# Patient Record
Sex: Female | Born: 1970 | Race: White | Hispanic: No | Marital: Single | Smoking: Never smoker
Health system: Southern US, Community
[De-identification: ages and names within clinical notes are randomized; demographics above are authoritative.]

## PROBLEM LIST (undated history)

## (undated) DIAGNOSIS — I10 Essential (primary) hypertension: Secondary | ICD-10-CM

## (undated) DIAGNOSIS — E785 Hyperlipidemia, unspecified: Secondary | ICD-10-CM

## (undated) HISTORY — DX: Hyperlipidemia, unspecified: E78.5

## (undated) HISTORY — DX: Essential (primary) hypertension: I10

## (undated) HISTORY — PX: CHOLECYSTECTOMY: SHX55

## (undated) HISTORY — PX: APPENDECTOMY: SHX54

## (undated) HISTORY — PX: ABDOMINAL HYSTERECTOMY: SHX81

---

## 2010-11-03 ENCOUNTER — Encounter: Payer: Self-pay | Admitting: Emergency Medicine

## 2010-11-03 ENCOUNTER — Ambulatory Visit
Admission: RE | Admit: 2010-11-03 | Discharge: 2010-11-03 | Payer: Self-pay | Source: Home / Self Care | Admitting: Emergency Medicine

## 2010-11-03 DIAGNOSIS — G43909 Migraine, unspecified, not intractable, without status migrainosus: Secondary | ICD-10-CM | POA: Insufficient documentation

## 2010-11-12 NOTE — Letter (Signed)
Summary: CONTROLLED MED RX POLICY  CONTROLLED MED RX POLICY   Imported By: Shelbie Proctor 11/03/2010 13:04:35  _____________________________________________________________________  External Attachment:    Type:   Image     Comment:   External Document

## 2010-11-12 NOTE — Assessment & Plan Note (Signed)
Summary: HEADACHE/TJ Room 5   Vital Signs:  Patient Profile:   40 Years Old Female CC:      Migraine x 3 days Height:     60 inches Weight:      180 pounds O2 Sat:      98 % O2 treatment:    Room Air Temp:     98.8 degrees F oral Pulse rate:   79 / minute Pulse rhythm:   regular Resp:     14 per minute BP sitting:   125 / 84  (left arm) Cuff size:   regular  Vitals Entered By: Emilio Math (November 03, 2010 12:59 PM)                  Current Allergies: No known allergies History of Present Illness Chief Complaint: Migraine x 3 days History of Present Illness: Migraine HA for 3 days.  Occurs approx Q3 months.  Started after her TAHBSO in her early 23's.  Doesn't have a current PCP or Gyn.  HA mostly in the back, sharp, photo/phonophobia.  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  No weakness, numbness, confusion, slurred speech, CP, or SOB.  Took a Percocet yesterday and it didn't help.  REVIEW OF SYSTEMS Constitutional Symptoms      Denies fever, chills, night sweats, weight loss, weight gain, and fatigue.  Eyes       Denies change in vision, eye pain, eye discharge, glasses, contact lenses, and eye surgery. Ear/Nose/Throat/Mouth       Denies hearing loss/aids, change in hearing, ear pain, ear discharge, dizziness, frequent runny nose, frequent nose bleeds, sinus problems, sore throat, hoarseness, and tooth pain or bleeding.  Respiratory       Denies dry cough, productive cough, wheezing, shortness of breath, asthma, bronchitis, and emphysema/COPD.  Cardiovascular       Denies murmurs, chest pain, and tires easily with exhertion.    Gastrointestinal       Complains of nausea/vomiting.      Denies stomach pain, diarrhea, constipation, blood in bowel movements, and indigestion. Genitourniary       Denies painful urination, kidney stones, and loss of urinary control. Neurological       Complains of headaches.      Denies  paralysis, seizures, and fainting/blackouts. Musculoskeletal       Denies muscle pain, joint pain, joint stiffness, decreased range of motion, redness, swelling, muscle weakness, and gout.  Skin       Denies bruising, unusual mles/lumps or sores, and hair/skin or nail changes.  Psych       Denies mood changes, temper/anger issues, anxiety/stress, speech problems, depression, and sleep problems.  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 Physical Exam General appearance: well developed, well nourished, mild distress from HA and wearing dark glasses Pupils: PERRLA Ears: normal, no lesions or deformities Oral/Pharynx: tongue normal, posterior pharynx without erythema or exudate Chest/Lungs: no rales, wheezes, or rhonchi bilateral, breath sounds equal without effort Heart: regular rate and  rhythm, no murmur Neurological: grossly intact and non-focal MSE: oriented to time, place, and person Assessment New Problems: MIGRAINE HEADACHE (ICD-346.90)   Plan New Medications/Changes: IMITREX 50 MG TABS (SUMATRIPTAN SUCCINATE) 1 by mouth at onset of headache, may repeat in 2 hours if not better.  Max of 2  per day.  #4 x 0, 11/03/2010, Hoyt Koch MD FLEXERIL 5 MG TABS (CYCLOBENZAPRINE HCL) 1 by mouth three times a day as needed for spasms  #15 x 0, 11/03/2010, Hoyt Koch MD ULTRACET 37.5-325 MG TABS (TRAMADOL-ACETAMINOPHEN) 1 by mouth q6 hrs as needed headache  #20 x 0, 11/03/2010, Hoyt Koch MD  New Orders: New Patient Level III [phone] Sumatriptan Succinate /6mg  injection [J3030] EMR miscellaneous medications [EMRORAL] Admin of Therapeutic Inj  intramuscular or subcutaneous [96372] Planning Comments:   Gave IM shot of Imitrex.  Gave Rx to try of Imitrex as well.  Gave Rx for Ultracet and  Flexeril.  Because this is a recurrent problem, I would like her to be followed by her PCP and/or Gyn.  If symptoms worsen or become more severe or any neurologic problems, go directly to ER or call EMS.  Encourage hydration and rest and dark.   The patient and/or caregiver has been counseled thoroughly with regard to medications prescribed including dosage, schedule, interactions, rationale for use, and possible side effects and they verbalize understanding.  Diagnoses and expected course of recovery discussed and will return if not improved as expected or if the condition worsens. Patient and/or caregiver verbalized understanding.  Prescriptions: IMITREX 50 MG TABS (SUMATRIPTAN SUCCINATE) 1 by mouth at onset of headache, may repeat in 2 hours if not better.  Max of 2 per day.  #4 x 0   Entered and Authorized by:   Hoyt Koch MD   Signed by:   Hoyt Koch MD on 11/03/2010   Method used:   Print then Give to Patient   RxID:   [id number] FLEXERIL 5 MG TABS (CYCLOBENZAPRINE HCL) 1 by mouth three times a day as needed for spasms  #15 x 0   Entered and Authorized by:   Hoyt Koch MD   Signed by:   Hoyt Koch MD on 11/03/2010   Method used:   Print then Give to Patient   RxID:   [id number] ULTRACET 37.5-325 MG TABS (TRAMADOL-ACETAMINOPHEN) 1 by mouth q6 hrs as needed headache  #20 x 0   Entered and Authorized by:   Hoyt Koch MD   Signed by:   Hoyt Koch MD on 11/03/2010   Method used:   Print then Give to Patient   RxID:   [id number]   Medication Administration  Injection # 1:    Medication: EMR miscellaneous medications    Diagnosis: MIGRAINE HEADACHE (ICD-346.90)    Route: SQ    Site: RUOQ gluteus    Exp Date: 08/12/2011    Lot #: [id number]    Mfr: [employer]    Given by: Emilio Math (November 03, 2010 1:26 PM)  Orders Added: 1)  New Patient Level III [99203] 2)  Sumatriptan Succinate /6mg  injection [J3030] 3)   EMR miscellaneous medications [EMRORAL] 4)  Admin of Therapeutic Inj  intramuscular or subcutaneous [96372]     Medication Administration  Injection # 1:    Medication: EMR miscellaneous medications    Diagnosis: MIGRAINE HEADACHE (ICD-346.90)    Route: SQ    Site: RUOQ gluteus    Exp Date: 08/12/2011    Lot #: [id number]    Mfr: [employer]    Given by: Emilio Math (November 03, 2010 1:26 PM)  Orders Added: 1)  New Patient Level III [99203] 2)  Sumatriptan Succinate /6mg  injection [J3030] 3)  EMR miscellaneous medications [EMRORAL] 4)  Admin of Therapeutic Inj  intramuscular or subcutaneous [10626]

## 2010-11-12 NOTE — Letter (Signed)
Summary: Out of Work  [facility]  [address]   [place], [place] [address]   Phone: [phone]  Fax: [phone]    November 03, 2010   Employee:  DESTINEE TABER Lieber    To Whom It May Concern:   For Medical reasons, please excuse the above named employee from work for the following dates:  January 24-25, 2012  If you need additional information, please feel free to contact our office.         Sincerely,    Hoyt Koch MD

## 2011-06-11 ENCOUNTER — Inpatient Hospital Stay (INDEPENDENT_AMBULATORY_CARE_PROVIDER_SITE_OTHER)
Admission: RE | Admit: 2011-06-11 | Discharge: 2011-06-11 | Disposition: A | Discharge status: Home / Self Care | Payer: PRIVATE HEALTH INSURANCE | Source: Ambulatory Visit | Attending: Family Medicine | Admitting: Family Medicine

## 2011-06-11 ENCOUNTER — Encounter: Payer: Self-pay | Admitting: Family Medicine

## 2011-06-11 DIAGNOSIS — G43909 Migraine, unspecified, not intractable, without status migrainosus: Secondary | ICD-10-CM

## 2011-06-15 ENCOUNTER — Telehealth (INDEPENDENT_AMBULATORY_CARE_PROVIDER_SITE_OTHER): Payer: Self-pay | Admitting: *Deleted

## 2011-09-13 NOTE — Telephone Encounter (Signed)
  Phone Note Outgoing Call   Call placed by: Clemens Catholic LPN,  June 15, 2011 5:55 PM Call placed to: Patient Summary of Call: call back: left message to call back if she has any questions or concerns. Initial call taken by: Clemens Catholic LPN,  June 15, 2011 5:55 PM

## 2011-09-13 NOTE — Progress Notes (Signed)
Summary: Migraine Headache/Swollen Eyes rm 4   Vital Signs:  Patient Profile:   40 Years Old Female CC:      Headache x 2 days Height:     60 inches Weight:      180.50 pounds O2 Sat:      97 % O2 treatment:    Room Air Temp:     98.7 degrees F oral Pulse rate:   67 / minute Resp:     18 per minute BP sitting:   123 / 79  (left arm) Cuff size:   regular  Vitals Entered By: Clemens Catholic LPN (June 11, 2011 9:27 AM)                  Updated Prior Medication List: No Medications Current Allergies (reviewed today): No known allergies History of Present Illness Chief Complaint: Headache x 2 days History of Present Illness:  Subjective:  Patient complains of onset of occipital throbbing headache two days ago that has now progressed to a generalized typical migraine headache.  No neuro symptoms.  No fevers, chills, and sweats.  She notes some mild swelling about the eyes but no change in vision.   REVIEW OF SYSTEMS Constitutional Symptoms      Denies fever, chills, night sweats, weight loss, weight gain, and fatigue.  Eyes       Complains of eye pain.      Denies change in vision, eye discharge, glasses, contact lenses, and eye surgery. Ear/Nose/Throat/Mouth       Denies hearing loss/aids, change in hearing, ear pain, ear discharge, dizziness, frequent runny nose, frequent nose bleeds, sinus problems, sore throat, hoarseness, and tooth pain or bleeding.  Respiratory       Denies dry cough, productive cough, wheezing, shortness of breath, asthma, bronchitis, and emphysema/COPD.  Cardiovascular       Denies murmurs, chest pain, and tires easily with exhertion.    Gastrointestinal       Denies stomach pain, nausea/vomiting, diarrhea, constipation, blood in bowel movements, and indigestion. Genitourniary       Denies painful urination, kidney stones, and loss of urinary control. Neurological       Complains of headaches.      Denies paralysis, seizures, and  fainting/blackouts. Musculoskeletal       Denies muscle pain, joint pain, joint stiffness, decreased range of motion, redness, swelling, muscle weakness, and gout.  Skin       Denies bruising, unusual mles/lumps or sores, and hair/skin or nail changes.  Psych       Denies mood changes, temper/anger issues, anxiety/stress, speech problems, depression, and sleep problems. Other Comments: pt c/o headache, vommited yesterday, and facial swelling  x 2 days. she has taken Tylenol and IBF with no relief. she also took 1 Percocet that her mother in law gave her. This is the second headache she has had this year.    Past History:  Past Medical History: Reviewed history from 11/03/2010 and no changes required. Unremarkable  Past Surgical History: Reviewed history from 11/03/2010 and no changes required. Appendectomy Cholecystectomy Hysterectomy  Family History: Reviewed history from 11/03/2010 and no changes required. Mother, HTN Father, HTN  Social History: Reviewed history from 11/03/2010 and no changes required. 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  Extraocular movement is intact.  Conjunctivae are not inflamed.  Fundi benign Ears:  Canals normal.  Tympanic membranes normal.   Nose:  Mildly congested turbinates.  No sinus tenderness  Pharynx:  Normal  Neck:  Supple.  No adenopathy is present. Neurologic:  Cranial nerves 2 through 12 are normal.  Patellar, achilles, and elbow reflexes are normal.  Cerebellar function is intact.  Gait and station are normal.     Plan New Medications/Changes: ZOFRAN 4 MG TABS (ONDANSETRON HCL) One or two tabs by mouth q4 to 6hr as needed nausea  #Ten (10) x 0, 06/11/2011, Donna Christen MD IMITREX 25 MG TABS (SUMATRIPTAN SUCCINATE) Take one by mouth for persistent headache.  May repeat after  two hours  #two x 0, 06/11/2011, Donna Christen MD ZOFRAN 4 MG TABS (ONDANSETRON HCL) One or two tabs by mouth q4 to 6hr as needed nausea  #Ten (10) x 0, 06/11/2011, Donna Christen MD IMITREX 25 MG TABS (SUMATRIPTAN SUCCINATE) Take one by mouth for persistent headache.  May repeat after two hours  #two x 0, 06/11/2011, Donna Christen MD  New Orders: Imitrex 6mg  - IM [CPT-J3030] Admin of Therapeutic Inj  intramuscular or subcutaneous [96372] Zofran 4mg  Tab [EMRORAL] Est. Patient Level IV [16109] Planning Comments:   Imitrex 6mg  IM, Zofran 4mg  Rx for Imitrex 25mg :  may take by mouth one hour after injection Imitrex if necessary. Recommend neuro evaluation if headaches become more frequent.   The patient and/or caregiver has been counseled thoroughly with regard to medications prescribed including dosage, schedule, interactions, rationale for use, and possible side effects and they verbalize understanding.  Diagnoses and expected course of recovery discussed and will return if not improved as expected or if the condition worsens. Patient and/or caregiver verbalized understanding.  Prescriptions: ZOFRAN 4 MG TABS (ONDANSETRON HCL) One or two tabs by mouth q4 to 6hr as needed nausea  #Ten (10) x 0   Entered and Authorized by:   Donna Christen MD   Signed by:   Donna Christen MD on 06/11/2011   Method used:   Print then Give to Patient   RxID:   [id number] IMITREX 25 MG TABS (SUMATRIPTAN SUCCINATE) Take one by mouth for persistent headache.  May repeat after two hours  #two x 0   Entered and Authorized by:   Donna Christen MD   Signed by:   Donna Christen MD on 06/11/2011   Method used:   Print then Give to Patient   RxID:   [id number] ZOFRAN 4 MG TABS (ONDANSETRON HCL) One or two tabs by mouth q4 to 6hr as needed nausea  #Ten (10) x 0   Entered and Authorized by:   Donna Christen MD   Signed by:   Donna Christen MD on 06/11/2011   Method used:   Print then Give to Patient   RxID:    [id number] IMITREX 25 MG TABS (SUMATRIPTAN SUCCINATE) Take one by mouth for persistent headache.  May repeat after two hours  #two x 0   Entered and Authorized by:   Donna Christen MD   Signed by:   Donna Christen MD on 06/11/2011   Method used:   Print then Give to Patient   RxID:   [id number]   Medication Administration  Injection # 1:    Medication: Imitrex 6mg  - IM    Diagnosis: MIGRAINE HEADACHE (ICD-346.90)    Route: IM    Site: R thigh    Exp Date: 12/09/2012    Lot #: [id number]    Mfr: Sagent    Patient tolerated  injection without complications    Given by: Clemens Catholic LPN (June 11, 2011 10:38 AM)  Medication # 1:    Medication: Zofran 4mg  Tab    Diagnosis: MIGRAINE HEADACHE (ICD-346.90)    Dose: 4 mg     Route: SL    Exp Date: 11/11/2012    Lot #: [id number]    Mfr: Sandoz    Comments: 4 mg ODT given    Patient tolerated medication without complications    Given by: Clemens Catholic LPN (June 11, 2011 10:39 AM)  Orders Added: 1)  Imitrex 6mg  - IM [CPT-J3030] 2)  Admin of Therapeutic Inj  intramuscular or subcutaneous [96372] 3)  Zofran 4mg  Tab [EMRORAL] 4)  Est. Patient Level IV [62130]

## 2014-01-01 ENCOUNTER — Ambulatory Visit: Payer: PRIVATE HEALTH INSURANCE | Admitting: Physician Assistant

## 2014-01-04 ENCOUNTER — Ambulatory Visit: Payer: PRIVATE HEALTH INSURANCE | Admitting: Physician Assistant

## 2014-01-04 DIAGNOSIS — Z0289 Encounter for other administrative examinations: Secondary | ICD-10-CM

## 2014-02-22 ENCOUNTER — Ambulatory Visit: Payer: PRIVATE HEALTH INSURANCE | Admitting: Physician Assistant

## 2014-02-26 ENCOUNTER — Ambulatory Visit (INDEPENDENT_AMBULATORY_CARE_PROVIDER_SITE_OTHER): Payer: PRIVATE HEALTH INSURANCE | Admitting: Physician Assistant

## 2014-02-26 ENCOUNTER — Encounter: Payer: Self-pay | Admitting: Physician Assistant

## 2014-02-26 VITALS — BP 138/87 | HR 73 | Ht 60.0 in | Wt 186.0 lb

## 2014-02-26 DIAGNOSIS — G4700 Insomnia, unspecified: Secondary | ICD-10-CM

## 2014-02-26 DIAGNOSIS — R635 Abnormal weight gain: Secondary | ICD-10-CM

## 2014-02-26 DIAGNOSIS — E785 Hyperlipidemia, unspecified: Secondary | ICD-10-CM

## 2014-02-26 DIAGNOSIS — E669 Obesity, unspecified: Secondary | ICD-10-CM

## 2014-02-26 DIAGNOSIS — M7989 Other specified soft tissue disorders: Secondary | ICD-10-CM

## 2014-02-26 DIAGNOSIS — Z23 Encounter for immunization: Secondary | ICD-10-CM

## 2014-02-26 DIAGNOSIS — Z131 Encounter for screening for diabetes mellitus: Secondary | ICD-10-CM

## 2014-02-26 DIAGNOSIS — Z1239 Encounter for other screening for malignant neoplasm of breast: Secondary | ICD-10-CM

## 2014-02-26 LAB — COMPLETE METABOLIC PANEL WITH GFR
ALBUMIN: 4.1 g/dL (ref 3.5–5.2)
ALK PHOS: 61 U/L (ref 39–117)
ALT: 20 U/L (ref 0–35)
AST: 18 U/L (ref 0–37)
BUN: 11 mg/dL (ref 6–23)
CO2: 28 mEq/L (ref 19–32)
Calcium: 9.6 mg/dL (ref 8.4–10.5)
Chloride: 104 mEq/L (ref 96–112)
Creat: 0.74 mg/dL (ref 0.50–1.10)
GFR, Est African American: >89 mL/min
GLUCOSE: 88 mg/dL (ref 70–99)
POTASSIUM: 4.4 meq/L (ref 3.5–5.3)
SODIUM: 140 meq/L (ref 135–145)
TOTAL PROTEIN: 6.7 g/dL (ref 6.0–8.3)
Total Bilirubin: 0.6 mg/dL (ref 0.2–1.2)

## 2014-02-26 LAB — LIPID PANEL
CHOL/HDL RATIO: 5.3 ratio
Cholesterol: 185 mg/dL (ref 0–200)
HDL: 35 mg/dL — ABNORMAL LOW (ref >39)
LDL CALC: 110 mg/dL — AB (ref 0–99)
Triglycerides: 201 mg/dL — ABNORMAL HIGH (ref <150)
VLDL: 40 mg/dL (ref 0–40)

## 2014-02-26 LAB — RHEUMATOID FACTOR: Rhuematoid fact SerPl-aCnc: <10 IU/mL (ref <=14)

## 2014-02-26 LAB — SEDIMENTATION RATE: Sed Rate: 1 mm/hr (ref 0–22)

## 2014-02-26 MED ORDER — SUVOREXANT 10 MG PO TABS: 1.0000 | ORAL_TABLET | Freq: Every day | ORAL | Status: DC

## 2014-02-26 MED ORDER — PHENTERMINE-TOPIRAMATE ER 3.75-23 MG PO CP24: 1.0000 | ORAL_CAPSULE | Freq: Every morning | ORAL | Status: DC

## 2014-02-26 MED ORDER — PHENTERMINE-TOPIRAMATE ER 7.5-46 MG PO CP24: 1.0000 | ORAL_CAPSULE | Freq: Every morning | ORAL | Status: DC

## 2014-02-26 NOTE — Patient Instructions (Signed)
Will get mammogram.

## 2014-02-27 ENCOUNTER — Encounter: Payer: Self-pay | Admitting: Physician Assistant

## 2014-02-27 DIAGNOSIS — E781 Pure hyperglyceridemia: Secondary | ICD-10-CM | POA: Insufficient documentation

## 2014-02-27 LAB — TSH: TSH: 1.129 u[IU]/mL (ref 0.350–4.500)

## 2014-02-27 LAB — CYCLIC CITRUL PEPTIDE ANTIBODY, IGG: Cyclic Citrullin Peptide Ab: <2 U/mL (ref 0.0–5.0)

## 2014-02-27 LAB — ANA: Anti Nuclear Antibody(ANA): NEGATIVE

## 2014-02-27 NOTE — Progress Notes (Signed)
   Subjective:    Patient ID: Janice [name], female    DOB: 1971-04-24, 43 y.o.   MRN: [id number]  HPI Pt is a 43 yo female who presents to the clinic to establish care.   .. Active Ambulatory Problems    Diagnosis Date Noted  . MIGRAINE HEADACHE 11/03/2010  . Insomnia 02/26/2014  . Hypertriglyceridemia, essential 02/27/2014   Resolved Ambulatory Problems    Diagnosis Date Noted  . No Resolved Ambulatory Problems   Past Medical History  Diagnosis Date  . Hyperlipidemia    .[name] Family History  Problem Relation Age of Onset  . Cancer Mother   . Heart attack Mother   . Hyperlipidemia Mother   . Hypertension Mother   . Stroke Mother   . Cancer Maternal Grandmother   . Hyperlipidemia Father   . Hypertension Father    .[name] History   Social History  . Marital Status: Married    Spouse Name: N/A    Number of Children: N/A  . Years of Education: N/A   Occupational History  . Not on file.   Social History Main Topics  . Smoking status: Never Smoker   . Smokeless tobacco: Not on file  . Alcohol Use: No  . Drug Use: No  . Sexual Activity: Yes   Other Topics Concern  . Not on file   Social History Narrative  . No narrative on file   Pt continues to struggle with insomnia. She finds it very hard to get to sleep. She is on ambien 10mg  nightly and still doesn't always help. She has been on medication for over 5 years. She has tried [place] without any benefit.   Wants to lose weight. Tried for last 6 months and does not seem to be getting anywhere. Would like help. Not tried medication before.   On the way out the door stated her hands have been swelling for last couple of weeks. No injury. Not tried anything. No pain but discomfort present.    Review of Systems  All other systems reviewed and are negative.      Objective:   Physical Exam  Constitutional: She is oriented to person, place, and time. She appears well-developed and well-nourished.  Obese.   HENT:   Head: Normocephalic and atraumatic.  Cardiovascular: Normal rate, regular rhythm and normal heart sounds.   Pulmonary/Chest: Effort normal and breath sounds normal.  Musculoskeletal:  Swelling around MCP joints of bilateral hands. No MCP enlargement. Hand grip 5/5. ROM normal and without pain.   Neurological: She is alert and oriented to person, place, and time.  Skin: Skin is dry.  Psychiatric: She has a normal mood and affect. Her behavior is normal.          Assessment & Plan:   Insomnia- will try belsomra. Stop ambien. Discussed side effects follow up after one month of being on medication. Reeducated on good bedtime routinue and addition of melatonin.   Obesity/abnormal weight gain- discussed importance of diet and exercise. Will start qsymia. Discussed side effects. Follow up in 6 weeks.  Bilateral hand swelling- pt mentioned this when I was going out the door. Discussed to come back for further work up. Will rule out RA with labs and hand xrays. Consider NSAIDS as needed.    Tdap given in office today.  Will order mammogram.  Screening labs ordered.

## 2014-03-12 ENCOUNTER — Ambulatory Visit: Payer: PRIVATE HEALTH INSURANCE

## 2014-04-09 ENCOUNTER — Ambulatory Visit: Payer: PRIVATE HEALTH INSURANCE | Admitting: Physician Assistant

## 2014-04-09 DIAGNOSIS — Z0289 Encounter for other administrative examinations: Secondary | ICD-10-CM

## 2014-05-01 ENCOUNTER — Encounter: Payer: Self-pay | Admitting: Family Medicine

## 2014-05-01 ENCOUNTER — Ambulatory Visit (INDEPENDENT_AMBULATORY_CARE_PROVIDER_SITE_OTHER): Payer: PRIVATE HEALTH INSURANCE | Admitting: Family Medicine

## 2014-05-01 ENCOUNTER — Other Ambulatory Visit: Payer: Self-pay | Admitting: Family Medicine

## 2014-05-01 ENCOUNTER — Ambulatory Visit (INDEPENDENT_AMBULATORY_CARE_PROVIDER_SITE_OTHER): Payer: PRIVATE HEALTH INSURANCE

## 2014-05-01 VITALS — BP 134/92 | HR 93 | Temp 98.1°F | Ht 60.0 in | Wt 184.0 lb

## 2014-05-01 DIAGNOSIS — R0789 Other chest pain: Secondary | ICD-10-CM

## 2014-05-01 DIAGNOSIS — R079 Chest pain, unspecified: Secondary | ICD-10-CM

## 2014-05-01 DIAGNOSIS — R071 Chest pain on breathing: Secondary | ICD-10-CM

## 2014-05-01 MED ORDER — HYDROCODONE-ACETAMINOPHEN 10-325 MG PO TABS: 0.5000 | ORAL_TABLET | Freq: Four times a day (QID) | ORAL | Status: DC | PRN

## 2014-05-01 NOTE — Progress Notes (Signed)
CC: Janice [name] is a 43 y.o. female is here for Flank Pain   Subjective: HPI:  Complains of left-sided chest wall pain has been present for 6 months however over the last one to 2 weeks has been worsening now moderate in severity at rest and moderate to severe with any pressure on the anterior or lateral aspect of the lower left rib cage. Barely improved with Aleve or heating pads, Flexeril provides no benefit at all. No other interventions. Symptoms are present all throughout the day and keep her awake at night but do not wake her from sleep. She denies nausea, vomiting, fevers, chills, shortness of breath, cough, abdominal pain, diarrhea, constipation or genitourinary complaints. No history of nephrolithiasis nor blood in her urine. Denies dysuria.   Review Of Systems Outlined In HPI  Past Medical History  Diagnosis Date  . Hyperlipidemia     Past Surgical History  Procedure Laterality Date  . Cholecystectomy    . Appendectomy    . Abdominal hysterectomy     Family History  Problem Relation Age of Onset  . Cancer Mother   . Heart attack Mother   . Hyperlipidemia Mother   . Hypertension Mother   . Stroke Mother   . Cancer Maternal Grandmother   . Hyperlipidemia Father   . Hypertension Father     History   Social History  . Marital Status: Married    Spouse Name: N/A    Number of Children: N/A  . Years of Education: N/A   Occupational History  . Not on file.   Social History Main Topics  . Smoking status: Never Smoker   . Smokeless tobacco: Not on file  . Alcohol Use: No  . Drug Use: No  . Sexual Activity: Yes   Other Topics Concern  . Not on file   Social History Narrative  . No narrative on file     Objective: BP 134/92  Pulse 93  Temp(Src) 98.1 F (36.7 C) (Oral)  Ht 5' (1.524 m)  Wt 184 lb (83.462 kg)  BMI 35.94 kg/m2  General: Alert and Oriented, No Acute Distress HEENT: Pupils equal, round, reactive to light. Conjunctivae clear.  Moist  membranes pharynx unremarkable Lungs: Clear to auscultation bilaterally, no wheezing/ronchi/rales.  Comfortable work of breathing. Good air movement. Cardiac: Regular rate and rhythm. Normal S1/S2.  No murmurs, rubs, nor gallops.   Chest: No skin abnormalities overlying the site of discomfort. Pain is reproduced with anterior posterior compression or lateral compression of the anterior lateral aspect of ribs 9 through 12 with radiation into the back. Abdomen: Normal bowel sounds, soft without guarding or rebound. No palpable masses. Extremities: No peripheral edema.  Strong peripheral pulses.  Mental Status: No depression, anxiety, nor agitation. Skin: Warm and dry.  Assessment & Plan: [name] was seen today for flank pain.  Diagnoses and associated orders for this visit:  Left-sided chest wall pain - Cancel: DG Ribs Unilateral Left; Future - HYDROcodone-acetaminophen (NORCO) 10-325 MG per tablet; Take 0.5-1 tablets by mouth every 6 (six) hours as needed.    Left sided chest wall pain: Suspect this is muscular strain such as intercostal strain will obtain an x-ray to rule out fracture given chronicity of her pain. Continue Aleve twice a day as long as x-rays rule out fracture, start hydrocodone for pain control. Discussed rest.   Return if symptoms worsen or fail to improve.

## 2014-07-31 ENCOUNTER — Emergency Department (INDEPENDENT_AMBULATORY_CARE_PROVIDER_SITE_OTHER): Payer: PRIVATE HEALTH INSURANCE

## 2014-07-31 ENCOUNTER — Emergency Department (INDEPENDENT_AMBULATORY_CARE_PROVIDER_SITE_OTHER)
Admission: EM | Admit: 2014-07-31 | Discharge: 2014-07-31 | Disposition: A | Discharge status: Home / Self Care | Payer: PRIVATE HEALTH INSURANCE | Source: Home / Self Care | Attending: Family Medicine | Admitting: Family Medicine

## 2014-07-31 ENCOUNTER — Encounter: Payer: Self-pay | Admitting: Emergency Medicine

## 2014-07-31 DIAGNOSIS — R109 Unspecified abdominal pain: Secondary | ICD-10-CM

## 2014-07-31 DIAGNOSIS — R1012 Left upper quadrant pain: Secondary | ICD-10-CM

## 2014-07-31 LAB — POCT CBC W AUTO DIFF (K'VILLE URGENT CARE)

## 2014-07-31 LAB — POCT URINALYSIS DIP (MANUAL ENTRY)
Bilirubin, UA: NEGATIVE
Blood, UA: NEGATIVE
Glucose, UA: NEGATIVE
Ketones, POC UA: NEGATIVE
Leukocytes, UA: NEGATIVE
Nitrite, UA: NEGATIVE
Protein Ur, POC: NEGATIVE
Spec Grav, UA: 1.025 (ref 1.005–1.03)
UROBILINOGEN UA: 0.2 (ref 0–1)
pH, UA: 6 (ref 5–8)

## 2014-07-31 MED ORDER — TRAMADOL HCL 50 MG PO TABS: 50.0000 mg | ORAL_TABLET | Freq: Four times a day (QID) | ORAL | Status: DC | PRN

## 2014-07-31 MED ORDER — OMEPRAZOLE 40 MG PO CPDR: 40.0000 mg | DELAYED_RELEASE_CAPSULE | Freq: Every day | ORAL | Status: DC

## 2014-07-31 MED ORDER — GI COCKTAIL ~~LOC~~
30.0000 mL | Freq: Once | ORAL | Status: AC
  Administered 2014-07-31: 30 mL via ORAL

## 2014-07-31 NOTE — ED Notes (Signed)
Pt c/o LUQ abd pain and LT side back pain x 73mths, worse x 2 wks. Denies N/V/D, bloody or black stool, or fever. She reports normal BM.

## 2014-07-31 NOTE — Discharge Instructions (Signed)
Thank you for coming in today. I am not sure yet what is causing your pain.  I will call you if anything come back positive.  Go to the ER if you get worse.  If your belly pain worsens, or you have high fever, bad vomiting, blood in your stool or black tarry stool go to the Emergency Room.  Follow up with your PCP.  Take omeprazole daily.  USe tramadol for severe pain.  Do not drive or use heavy machinery after taking this medicine.    Abdominal Pain, Women Abdominal (stomach, pelvic, or belly) pain can be caused by many things. It is important to tell your doctor:  The location of the pain.  Does it come and go or is it present all the time?  Are there things that start the pain (eating certain foods, exercise)?  Are there other symptoms associated with the pain (fever, nausea, vomiting, diarrhea)? All of this is helpful to know when trying to find the cause of the pain. CAUSES   Stomach: virus or bacteria infection, or ulcer.  Intestine: appendicitis (inflamed appendix), regional ileitis (Crohn's disease), ulcerative colitis (inflamed colon), irritable bowel syndrome, diverticulitis (inflamed diverticulum of the colon), or cancer of the stomach or intestine.  Gallbladder disease or stones in the gallbladder.  Kidney disease, kidney stones, or infection.  Pancreas infection or cancer.  Fibromyalgia (pain disorder).  Diseases of the female organs:  Uterus: fibroid (non-cancerous) tumors or infection.  Fallopian tubes: infection or tubal pregnancy.  Ovary: cysts or tumors.  Pelvic adhesions (scar tissue).  Endometriosis (uterus lining tissue growing in the pelvis and on the pelvic organs).  Pelvic congestion syndrome (female organs filling up with blood just before the menstrual period).  Pain with the menstrual period.  Pain with ovulation (producing an egg).  Pain with an IUD (intrauterine device, birth control) in the uterus.  Cancer of the female  organs.  Functional pain (pain not caused by a disease, may improve without treatment).  Psychological pain.  Depression. DIAGNOSIS  Your doctor will decide the seriousness of your pain by doing an examination.  Blood tests.  X-rays.  Ultrasound.  CT scan (computed tomography, special type of X-ray).  MRI (magnetic resonance imaging).  Cultures, for infection.  Barium enema (dye inserted in the large intestine, to better view it with X-rays).  Colonoscopy (looking in intestine with a lighted tube).  Laparoscopy (minor surgery, looking in abdomen with a lighted tube).  Major abdominal exploratory surgery (looking in abdomen with a large incision). TREATMENT  The treatment will depend on the cause of the pain.   Many cases can be observed and treated at home.  Over-the-counter medicines recommended by your caregiver.  Prescription medicine.  Antibiotics, for infection.  Birth control pills, for painful periods or for ovulation pain.  Hormone treatment, for endometriosis.  Nerve blocking injections.  Physical therapy.  Antidepressants.  Counseling with a psychologist or psychiatrist.  Minor or major surgery. HOME CARE INSTRUCTIONS   Do not take laxatives, unless directed by your caregiver.  Take over-the-counter pain medicine only if ordered by your caregiver. Do not take aspirin because it can cause an upset stomach or bleeding.  Try a clear liquid diet (broth or water) as ordered by your caregiver. Slowly move to a bland diet, as tolerated, if the pain is related to the stomach or intestine.  Have a thermometer and take your temperature several times a day, and record it.  Bed rest and sleep, if it helps  the pain.  Avoid sexual intercourse, if it causes pain.  Avoid stressful situations.  Keep your follow-up appointments and tests, as your caregiver orders.  If the pain does not go away with medicine or surgery, you may  try:  Acupuncture.  Relaxation exercises (yoga, meditation).  Group therapy.  Counseling. SEEK MEDICAL CARE IF:   You notice certain foods cause stomach pain.  Your home care treatment is not helping your pain.  You need stronger pain medicine.  You want your IUD removed.  You feel faint or lightheaded.  You develop nausea and vomiting.  You develop a rash.  You are having side effects or an allergy to your medicine. SEEK IMMEDIATE MEDICAL CARE IF:   Your pain does not go away or gets worse.  You have a fever.  Your pain is felt only in portions of the abdomen. The right side could possibly be appendicitis. The left lower portion of the abdomen could be colitis or diverticulitis.  You are passing blood in your stools (bright red or black tarry stools, with or without vomiting).  You have blood in your urine.  You develop chills, with or without a fever.  You pass out. MAKE SURE YOU:   Understand these instructions.  Will watch your condition.  Will get help right away if you are not doing well or get worse. Document Released: 07/25/2007 Document Revised: 02/11/2014 Document Reviewed: 08/14/2009 [facility] Patient Information 2015 [place], [place]. This information is not intended to replace advice given to you by your health care provider. Make sure you discuss any questions you have with your health care provider.

## 2014-07-31 NOTE — ED Provider Notes (Signed)
Shanaye Rief Semple is a 43 y.o. female who presents to Urgent Care today for upper left abdominal pain and back pain. Patient has had 6 months of mild intermittent back and upper left abdominal pain. However the pain has been worsening over the past 2 weeks. The pain is sharp and severe. The pain is present and worsening with motion. No chest pains or palpitations. No significant shortness of breath. She is able to eat and drink normally.    Past Surgical History  Procedure Laterality Date  . Cholecystectomy    . Appendectomy    . Abdominal hysterectomy      Past Medical History  Diagnosis Date  . Hyperlipidemia    History  Substance Use Topics  . Smoking status: Never Smoker   . Smokeless tobacco: Not on file  . Alcohol Use: No   ROS as above Medications: No current facility-administered medications for this encounter.   Current Outpatient Prescriptions  Medication Sig Dispense Refill  . cyclobenzaprine (FLEXERIL) 10 MG tablet Take 10 mg by mouth daily.      [name] zolpidem (AMBIEN) 10 MG tablet Take 10 mg by mouth at bedtime.      [name] HYDROcodone-acetaminophen (NORCO) 10-325 MG per tablet Take 0.5-1 tablets by mouth every 6 (six) hours as needed.  45 tablet  0  . Phentermine-Topiramate 3.75-23 MG CP24 Take 1 tablet by mouth every morning.  14 capsule  0  . Phentermine-Topiramate 7.5-46 MG CP24 Take 1 tablet by mouth every morning.  30 capsule  0  . Suvorexant (BELSOMRA) 10 MG TABS Take 1 tablet by mouth daily.  30 tablet  1    Exam:  BP 138/91  Pulse 80  Temp(Src) 98.5 F (36.9 C) (Oral)  Resp 16  Ht 5' (1.524 m)  Wt 180 lb (81.647 kg)  BMI 35.15 kg/m2  SpO2 97% Gen: Well NAD HEENT: EOMI,  MMM Lungs: Normal work of breathing. CTABL Heart: RRR no MRG Abd: NABS, Soft. Nondistended, tender to palpation upper left quadrant. Tender percussion left CV angle Back: Mildly tender high left lumbar paraspinal normal back range of motion. Exts: Brisk capillary refill, warm and well  perfused.  Skin: No rash  Patient was given a GI cocktail, and did not feel any better  12 lead ekg: Normal sinus rhythm at 74 beats per minute. No ST segment elevation or depression. No Q waves.   CBC: WBC: 7.3 Hb 14.4 Plts 337 Results for orders placed during the hospital encounter of 07/31/14 (from the past 24 hour(s))  POCT URINALYSIS DIP (MANUAL ENTRY)     Status: None   Collection Time    07/31/14 10:31 AM      Result Value Ref Range   Color, UA yellow     Clarity, UA clear     Glucose, UA neg     Bilirubin, UA negative     Bilirubin, UA negative     Spec Grav, UA 1.025  1.005 - 1.03   Blood, UA negative     pH, UA 6.0  5 - 8   Protein Ur, POC negative     Urobilinogen, UA 0.2  0 - 1   Nitrite, UA Negative     Leukocytes, UA Negative     Dg Abd Acute W/chest  07/31/2014   CLINICAL DATA:  Left-sided abdominal/flank pain, worse when lying down. History of hysterectomy, cholecystectomy, and appendectomy.  EXAM: ACUTE ABDOMEN SERIES (ABDOMEN 2 VIEW & CHEST 1 VIEW)  COMPARISON:  Rib radiographs 05/01/2014  FINDINGS: The cardiomediastinal silhouette is within normal limits. The lungs are well inflated and clear. There is no evidence of pleural effusion or pneumothorax. No acute osseous abnormality is identified.  No intraperitoneal free air is identified. No air-fluid levels are seen in the abdomen. Gas is scattered throughout nondilated small and large bowel without evidence of obstruction. Small amount of stool is present throughout the colon. Right upper quadrant abdominal surgical clips are noted. Lower pelvic calcifications likely represent phleboliths. Rounded calcification projecting over the left iliac wing is unchanged.  IMPRESSION: 1. No evidence of acute cardiopulmonary abnormality. 2. Nonobstructed bowel-gas pattern.   Electronically Signed   By: Logan Bores   On: 07/31/2014 11:26    Assessment and Plan: 43 y.o. female with upper left quadrant abdominal pain. Etiology is  unclear. At this point I do not think she has a serious life-threatening illness this or chest/abd x-ray and EKG and CBC are all normal. Comprehensive metabolic panel and lipase are both pending and I will call the patient if they are abnormal. I think possibly she has gastritis. Plan to treat with omeprazole and tramadol. If worsening patient will go to the emergency room. If not improved she will followup with her primary care provider in 2 days.  Discussed warning signs or symptoms. Please see discharge instructions. Patient expresses understanding.     Gregor Hams, MD 07/31/14 1149

## 2014-08-01 ENCOUNTER — Telehealth: Payer: Self-pay | Admitting: Emergency Medicine

## 2014-08-01 LAB — COMPLETE METABOLIC PANEL WITH GFR
ALBUMIN: 4.3 g/dL (ref 3.5–5.2)
ALT: 19 U/L (ref 0–35)
AST: 18 U/L (ref 0–37)
Alkaline Phosphatase: 71 U/L (ref 39–117)
BUN: 11 mg/dL (ref 6–23)
CHLORIDE: 106 meq/L (ref 96–112)
CO2: 26 meq/L (ref 19–32)
CREATININE: 0.64 mg/dL (ref 0.50–1.10)
Calcium: 9.6 mg/dL (ref 8.4–10.5)
GFR, Est African American: >89 mL/min
Glucose, Bld: 111 mg/dL — ABNORMAL HIGH (ref 70–99)
Potassium: 5 mEq/L (ref 3.5–5.3)
Sodium: 140 mEq/L (ref 135–145)
Total Bilirubin: 1.1 mg/dL (ref 0.2–1.2)
Total Protein: 7 g/dL (ref 6.0–8.3)

## 2014-08-01 LAB — LIPASE: Lipase: 22 U/L (ref 0–75)

## 2014-08-02 ENCOUNTER — Encounter: Payer: Self-pay | Admitting: Physician Assistant

## 2014-08-02 ENCOUNTER — Telehealth: Payer: Self-pay | Admitting: *Deleted

## 2014-08-02 ENCOUNTER — Ambulatory Visit (INDEPENDENT_AMBULATORY_CARE_PROVIDER_SITE_OTHER): Payer: PRIVATE HEALTH INSURANCE

## 2014-08-02 ENCOUNTER — Ambulatory Visit (INDEPENDENT_AMBULATORY_CARE_PROVIDER_SITE_OTHER): Payer: PRIVATE HEALTH INSURANCE | Admitting: Physician Assistant

## 2014-08-02 VITALS — BP 139/83 | HR 87 | Ht 60.0 in | Wt 184.0 lb

## 2014-08-02 DIAGNOSIS — K760 Fatty (change of) liver, not elsewhere classified: Secondary | ICD-10-CM | POA: Insufficient documentation

## 2014-08-02 DIAGNOSIS — I2510 Atherosclerotic heart disease of native coronary artery without angina pectoris: Secondary | ICD-10-CM

## 2014-08-02 DIAGNOSIS — D3501 Benign neoplasm of right adrenal gland: Secondary | ICD-10-CM

## 2014-08-02 DIAGNOSIS — R1012 Left upper quadrant pain: Secondary | ICD-10-CM

## 2014-08-02 MED ORDER — IOHEXOL 300 MG/ML  SOLN
100.0000 mL | Freq: Once | INTRAMUSCULAR | Status: AC | PRN
  Administered 2014-08-02: 100 mL via INTRAVENOUS

## 2014-08-02 MED ORDER — SUCRALFATE 1 G PO TABS: 1.0000 g | ORAL_TABLET | Freq: Four times a day (QID) | ORAL | Status: DC

## 2014-08-02 MED ORDER — PANTOPRAZOLE SODIUM 40 MG PO TBEC: 40.0000 mg | DELAYED_RELEASE_TABLET | Freq: Every day | ORAL | Status: DC

## 2014-08-02 NOTE — Progress Notes (Signed)
   Subjective:    Patient ID: Janice [name], female    DOB: 03-15-1971, 43 y.o.   MRN: [id number]  HPI Pt is a 43 yo female who presents to the clinic with 6 months of left upper quadrant pain that has worsened in last 2 weeks and that has become constant. Pain radiates to back. Rates pain 7/10. Denies worsening when eats. No trauma to area. No fever, chills, vomiting, chills. Her bowel movements have increased to 2-3 times a day but reports are more light and milky. Denies any black tarry stools or bright red blood. Continues to take prilosec. Went to UC 2 days ago. CXR/abdominal xray, lipase, cbc, cmp were all good. No new medications.      Review of Systems  All other systems reviewed and are negative.      Objective:   Physical Exam  Constitutional: She is oriented to person, place, and time. She appears well-developed and well-nourished.  HENT:  Head: Normocephalic and atraumatic.  Cardiovascular: Normal rate, regular rhythm and normal heart sounds.   Pulmonary/Chest: Effort normal and breath sounds normal.  Abdominal: Soft. Bowel sounds are normal.  LUQ tenderness and guarding. No rebound.   Neurological: She is alert and oriented to person, place, and time.  Skin: Skin is dry.  Psychiatric: She has a normal mood and affect. Her behavior is normal.          Assessment & Plan:  Left upper quadrant pain- etiology unclear. Seems consistent with gastritis;however, pt has not had any improvement with omeprazole but that is what she has been on. CT scan stat ordered. Lipase, cmp, cbc all reassuring. Stop omeprazole and start protonix daily. Given carafate to take up to 4 times a day. Will call with CT results. Call with any worsening pain.

## 2014-08-02 NOTE — Telephone Encounter (Signed)
Spoke with Santiago Glad @ [facility] and no prior auth required for CT. Radiology notified. Margette Fast, RMA

## 2014-08-05 ENCOUNTER — Other Ambulatory Visit: Payer: Self-pay | Admitting: Physician Assistant

## 2014-08-05 DIAGNOSIS — A048 Other specified bacterial intestinal infections: Secondary | ICD-10-CM | POA: Insufficient documentation

## 2014-08-05 LAB — H. PYLORI ANTIBODY, IGG: H Pylori IgG: 1.55 {ISR} — ABNORMAL HIGH

## 2014-08-05 MED ORDER — CLARITHROMYCIN 500 MG PO TABS: 500.0000 mg | ORAL_TABLET | Freq: Two times a day (BID) | ORAL | Status: DC

## 2014-08-05 MED ORDER — METRONIDAZOLE 500 MG PO TABS: 500.0000 mg | ORAL_TABLET | Freq: Two times a day (BID) | ORAL | Status: DC

## 2014-08-07 ENCOUNTER — Ambulatory Visit: Payer: PRIVATE HEALTH INSURANCE | Admitting: Physician Assistant

## 2014-08-09 ENCOUNTER — Encounter: Payer: Self-pay | Admitting: Physician Assistant

## 2014-08-09 ENCOUNTER — Ambulatory Visit (INDEPENDENT_AMBULATORY_CARE_PROVIDER_SITE_OTHER): Payer: PRIVATE HEALTH INSURANCE | Admitting: Physician Assistant

## 2014-08-09 VITALS — BP 116/76 | HR 89 | Ht 60.0 in | Wt 181.0 lb

## 2014-08-09 DIAGNOSIS — I2510 Atherosclerotic heart disease of native coronary artery without angina pectoris: Secondary | ICD-10-CM

## 2014-08-09 DIAGNOSIS — K760 Fatty (change of) liver, not elsewhere classified: Secondary | ICD-10-CM

## 2014-08-09 MED ORDER — ROSUVASTATIN CALCIUM 20 MG PO TABS: 20.0000 mg | ORAL_TABLET | Freq: Every day | ORAL | Status: DC

## 2014-08-09 MED ORDER — TRAMADOL HCL 50 MG PO TABS: 50.0000 mg | ORAL_TABLET | Freq: Four times a day (QID) | ORAL | Status: DC | PRN

## 2014-08-09 NOTE — Progress Notes (Signed)
   Subjective:    Patient ID: Janice [name], female    DOB: 1971-04-19, 43 y.o.   MRN: [id number]  HPI Pt presents to the clinic to discuss CT of abdomen results.    Review of Systems  All other systems reviewed and are negative.      Objective:   Physical Exam  Constitutional: She is oriented to person, place, and time. She appears well-developed and well-nourished.  Neurological: She is alert and oriented to person, place, and time.  Psychiatric: She has a normal mood and affect. Her behavior is normal.          Assessment & Plan:  Atherosclerosis of coronary arteries- discussed this is premature and concerning with family hx of mother having HTN, hyperlipidemia, MI and stroke. Started on crestor 20mg  daily. Given card to make 3 dollars. Will recheck lipids in 6 months. Would like to evaluate with stress test. Discuss red flags sign of stroke and MI. EKG done early October was reassuring and normal. Also encouraged pt to work on weight loss and low saturated fat diet.   Fatty liver- also found on CT. Discussed weight loss and diet management.

## 2014-08-12 ENCOUNTER — Telehealth: Payer: Self-pay | Admitting: *Deleted

## 2014-08-12 NOTE — Telephone Encounter (Signed)
No prior auth required for exercise tolerance test. Margette Fast, CMa

## 2014-08-30 ENCOUNTER — Telehealth (HOSPITAL_COMMUNITY): Payer: Self-pay

## 2014-08-30 NOTE — Telephone Encounter (Signed)
Encounter complete. 

## 2014-09-04 ENCOUNTER — Encounter (HOSPITAL_COMMUNITY): Payer: PRIVATE HEALTH INSURANCE

## 2014-09-27 ENCOUNTER — Telehealth (HOSPITAL_COMMUNITY): Payer: Self-pay

## 2014-09-27 NOTE — Telephone Encounter (Signed)
Encounter complete. 

## 2014-10-01 ENCOUNTER — Telehealth (HOSPITAL_COMMUNITY): Payer: Self-pay

## 2014-10-01 NOTE — Telephone Encounter (Signed)
Encounter complete. 

## 2014-10-02 ENCOUNTER — Encounter (HOSPITAL_COMMUNITY): Payer: PRIVATE HEALTH INSURANCE

## 2014-10-08 ENCOUNTER — Ambulatory Visit: Payer: PRIVATE HEALTH INSURANCE | Admitting: Physician Assistant

## 2014-10-16 ENCOUNTER — Ambulatory Visit: Payer: PRIVATE HEALTH INSURANCE | Admitting: Physician Assistant

## 2014-10-18 ENCOUNTER — Encounter (HOSPITAL_COMMUNITY): Payer: Self-pay | Admitting: *Deleted

## 2014-10-24 ENCOUNTER — Telehealth (HOSPITAL_COMMUNITY): Payer: Self-pay

## 2014-10-24 NOTE — Telephone Encounter (Signed)
Encounter complete. 

## 2014-10-25 ENCOUNTER — Telehealth (HOSPITAL_COMMUNITY): Payer: Self-pay

## 2014-10-25 NOTE — Telephone Encounter (Signed)
Encounter complete. 

## 2014-10-29 ENCOUNTER — Ambulatory Visit (HOSPITAL_COMMUNITY)
Admission: RE | Admit: 2014-10-29 | Discharge: 2014-10-29 | Disposition: A | Discharge status: Home / Self Care | Payer: Managed Care, Other (non HMO) | Source: Ambulatory Visit | Attending: Cardiology | Admitting: Cardiology

## 2014-10-29 ENCOUNTER — Telehealth (HOSPITAL_COMMUNITY): Payer: Self-pay

## 2014-10-29 DIAGNOSIS — I2510 Atherosclerotic heart disease of native coronary artery without angina pectoris: Secondary | ICD-10-CM | POA: Diagnosis not present

## 2014-10-29 NOTE — Procedures (Signed)
Exercise Treadmill Test   Test  Exercise Tolerance Test Ordering MD: Iran Planas, PA-C    Unique Test No: 1  Treadmill:  1  Indication for ETT: CT Scan  Contraindication to ETT: No   Stress Modality: exercise - treadmill  Cardiac Imaging Performed: non   Protocol: standard Bruce - maximal  Max BP:  151/79  Max MPHR (bpm):  176 85% MPR (bpm):  149  MPHR obtained (bpm):  171 % MPHR obtained:  97  Reached 85% MPHR (min:sec):   Total Exercise Time (min-sec):  9  Workload in METS:  10.1 Borg Scale: 15  Reason ETT Terminated:  fatigue    ST Segment Analysis At Rest: normal ST segments - no evidence of significant ST depression With Exercise: borderline ST changes  Other Information Arrhythmia:  Scattered PVC's Angina during ETT:  absent (0) Quality of ETT:  diagnostic  ETT Interpretation:  normal - no evidence of ischemia by ST analysis  Comments: Borderline upsloping ST depression inferiorly and laterally Scattered PVC's No chest pain Good exercise tolerance Duke Treadmill Score of +9  Pixie Casino, MD, [facility] Attending Cardiologist [place]

## 2014-10-29 NOTE — Telephone Encounter (Signed)
Encounter complete. 

## 2014-10-30 ENCOUNTER — Other Ambulatory Visit: Payer: Self-pay | Admitting: Physician Assistant

## 2014-10-30 DIAGNOSIS — I2510 Atherosclerotic heart disease of native coronary artery without angina pectoris: Secondary | ICD-10-CM

## 2014-10-30 DIAGNOSIS — R9439 Abnormal result of other cardiovascular function study: Secondary | ICD-10-CM

## 2014-11-27 ENCOUNTER — Institutional Professional Consult (permissible substitution): Payer: Managed Care, Other (non HMO) | Admitting: Cardiology

## 2015-01-06 ENCOUNTER — Encounter: Payer: Self-pay | Admitting: *Deleted

## 2015-01-06 ENCOUNTER — Emergency Department (INDEPENDENT_AMBULATORY_CARE_PROVIDER_SITE_OTHER)
Admission: EM | Admit: 2015-01-06 | Discharge: 2015-01-06 | Disposition: A | Discharge status: Home / Self Care | Payer: PRIVATE HEALTH INSURANCE | Source: Home / Self Care | Attending: Family Medicine | Admitting: Family Medicine

## 2015-01-06 DIAGNOSIS — R197 Diarrhea, unspecified: Secondary | ICD-10-CM | POA: Diagnosis not present

## 2015-01-06 DIAGNOSIS — R112 Nausea with vomiting, unspecified: Secondary | ICD-10-CM | POA: Diagnosis not present

## 2015-01-06 LAB — POCT CBC W AUTO DIFF (K'VILLE URGENT CARE)

## 2015-01-06 MED ORDER — ONDANSETRON 4 MG PO TBDP
4.0000 mg | ORAL_TABLET | Freq: Once | ORAL | Status: AC
  Administered 2015-01-06: 4 mg via ORAL

## 2015-01-06 MED ORDER — ONDANSETRON 4 MG PO TBDP: ORAL_TABLET | ORAL | Status: DC

## 2015-01-06 NOTE — Discharge Instructions (Signed)

## 2015-01-06 NOTE — ED Provider Notes (Addendum)
CSN: [id number]     Arrival date & time 01/06/15  4270 History   First MD Initiated Contact with Patient 01/06/15 [phone]     Chief Complaint  Patient presents with  . Emesis  . Diarrhea      HPI Comments: Patient awoke two days ago with nausea/vomiting, and yesterday developed watery diarrhea.  She has also had sweats/chills, fatigue, myalgias, and headache.  No urinary symptoms.  She states that a co-worker had similar symptoms.  Denies recent foreign travel, or drinking untreated water in a wilderness environment.  She denies recent antibiotic use.  Past surgical history of TAH, cholecystectomy, and appendectomy.  Patient is a 44 y.o. female presenting with vomiting. The history is provided by the patient.  Emesis Severity:  Moderate Duration:  2 days Timing:  Intermittent Quality:  Stomach contents Able to tolerate:  Liquids Progression:  Improving Chronicity:  New Recent urination:  Decreased Relieved by:  None tried Worsened by:  Food smell Associated symptoms: chills, diarrhea, fever, headaches, myalgias and sore throat   Associated symptoms: no abdominal pain, no arthralgias, no cough and no URI   Diarrhea:    Quality:  Watery   Severity:  Moderate   Duration:  1 day   Timing:  Intermittent   Progression:  Improving Risk factors: sick contacts   Risk factors: no suspect food intake and no travel to endemic areas     Past Medical History  Diagnosis Date  . Hyperlipidemia    Past Surgical History  Procedure Laterality Date  . Cholecystectomy    . Appendectomy    . Abdominal hysterectomy     Family History  Problem Relation Age of Onset  . Cancer Mother   . Heart attack Mother   . Hyperlipidemia Mother   . Hypertension Mother   . Stroke Mother   . Cancer Maternal Grandmother   . Hyperlipidemia Father   . Hypertension Father    History  Substance Use Topics  . Smoking status: Never Smoker   . Smokeless tobacco: Not on file  . Alcohol Use: No   OB History     No data available     Review of Systems  Constitutional: Positive for chills.  HENT: Positive for sore throat.   Gastrointestinal: Positive for vomiting and diarrhea. Negative for abdominal pain.  Musculoskeletal: Positive for myalgias. Negative for arthralgias.  Neurological: Positive for headaches.  All other systems reviewed and are negative.   Allergies  Review of patient's allergies indicates no known allergies.  Home Medications   Prior to Admission medications   Medication Sig Start Date End Date Taking? Authorizing Provider  cyclobenzaprine (FLEXERIL) 10 MG tablet Take 10 mg by mouth daily.    Historical Provider, MD  ondansetron (ZOFRAN ODT) 4 MG disintegrating tablet Take one tab by mouth Q6hr prn nausea 01/06/15   Kandra Nicolas, MD  pantoprazole (PROTONIX) 40 MG tablet Take 1 tablet (40 mg total) by mouth daily. 08/02/14   Jade L Breeback, PA-C  rosuvastatin (CRESTOR) 20 MG tablet Take 1 tablet (20 mg total) by mouth daily. 08/09/14   Jade L Breeback, PA-C  sucralfate (CARAFATE) 1 G tablet Take 1 tablet (1 g total) by mouth 4 (four) times daily. 08/02/14   Jade L Breeback, PA-C  zolpidem (AMBIEN) 10 MG tablet Take 10 mg by mouth at bedtime.    Historical Provider, MD   BP 149/103 mmHg  Pulse 83  Temp(Src) 98.4 F (36.9 C) (Oral)  Resp 18  Ht  5' (1.524 m)  Wt 179 lb (81.194 kg)  BMI 34.96 kg/m2  SpO2 99% Physical Exam Nursing notes and Vital Signs reviewed. Appearance:  Patient appears stated age, and in no acute distress.  Patient is obese (BMI 35.0) Eyes:  Pupils are equal, round, and reactive to light and accomodation.  Extraocular movement is intact.  Conjunctivae are not inflamed  Pharynx:  Normal; moist mucous membranes  Neck:  Supple.  No adenopathy Lungs:  Clear to auscultation.  Breath sounds are equal.  Heart:  Regular rate and rhythm without murmurs, rubs, or gallops.  Abdomen:   Mild diffuse tenderness without masses or hepatosplenomegaly.  Bowel  sounds are present.  No CVA or flank tenderness.  Extremities:  No edema.  No calf tenderness Skin:  No rash present.   ED Course  Procedures  None  Labs Reviewed -  POCT CBC:  WBC 7.7; LY 42.1; MO 5.5; GR 52.4; Hgb 14.3; Platelets 302  MDM   1. Nausea and vomiting, vomiting of unspecified type; suspect viral gastroenteritis   2. Diarrhea    Administered Zofran ODT 4mg  po.  Rx given for same Begin clear liquids (Pedialyte while having diarrhea) until improved, then advance to a [employer] (Bananas, Rice, Applesauce, Toast).  Then gradually resume a regular diet when tolerated.  Avoid milk products until well.  To decrease diarrhea, mix one teaspoon Citrucel (methylcellulose) in 2 oz water and drink one to three times daily.  Do not drink extra fluids with this dose and do not drink fluids for one hour afterwards.  When stools become more formed, may take Imodium (loperamide) once or twice daily to decrease stool frequency.  If symptoms become significantly worse during the night or over the weekend, proceed to the local emergency room.     Kandra Nicolas, MD 01/06/15 [place], MD 01/06/15 [phone]

## 2015-01-06 NOTE — ED Notes (Signed)
Pt c/o vomiting, diarrhea and head pressure x 2 days.

## 2015-01-09 ENCOUNTER — Telehealth: Payer: Self-pay | Admitting: *Deleted

## 2015-01-15 ENCOUNTER — Encounter: Payer: Managed Care, Other (non HMO) | Admitting: Cardiology

## 2015-01-15 NOTE — Progress Notes (Signed)
     HPI: 44 year old female for evaluation of dyspnea. Abdominal CT October 2015 showed calcium in LAD. Exercise treadmill January 2016-duration 9 minutes, upsloping ST depression felt to be normal.  Current Outpatient Prescriptions  Medication Sig Dispense Refill  . cyclobenzaprine (FLEXERIL) 10 MG tablet Take 10 mg by mouth daily.    . ondansetron (ZOFRAN ODT) 4 MG disintegrating tablet Take one tab by mouth Q6hr prn nausea 12 tablet 0  . pantoprazole (PROTONIX) 40 MG tablet Take 1 tablet (40 mg total) by mouth daily. 30 tablet 1  . rosuvastatin (CRESTOR) 20 MG tablet Take 1 tablet (20 mg total) by mouth daily. 30 tablet 11  . sucralfate (CARAFATE) 1 G tablet Take 1 tablet (1 g total) by mouth 4 (four) times daily. 120 tablet 0  . zolpidem (AMBIEN) 10 MG tablet Take 10 mg by mouth at bedtime.     No current facility-administered medications for this visit.    No Known Allergies  Past Medical History  Diagnosis Date  . Hyperlipidemia     Past Surgical History  Procedure Laterality Date  . Cholecystectomy    . Appendectomy    . Abdominal hysterectomy      History   Social History  . Marital Status: Married    Spouse Name: N/A  . Number of Children: N/A  . Years of Education: N/A   Occupational History  . Not on file.   Social History Main Topics  . Smoking status: Never Smoker   . Smokeless tobacco: Not on file  . Alcohol Use: No  . Drug Use: No  . Sexual Activity: Yes   Other Topics Concern  . Not on file   Social History Narrative    Family History  Problem Relation Age of Onset  . Cancer Mother   . Heart attack Mother   . Hyperlipidemia Mother   . Hypertension Mother   . Stroke Mother   . Cancer Maternal Grandmother   . Hyperlipidemia Father   . Hypertension Father     ROS: no fevers or chills, productive cough, hemoptysis, dysphasia, odynophagia, melena, hematochezia, dysuria, hematuria, rash, seizure activity, orthopnea, PND, pedal edema,  claudication. Remaining systems are negative.  Physical Exam:   There were no vitals taken for this visit.  General:  Well developed/well nourished in NAD Skin warm/dry Patient not depressed No peripheral clubbing Back-normal HEENT-normal/normal eyelids Neck supple/normal carotid upstroke bilaterally; no bruits; no JVD; no thyromegaly chest - CTA/ normal expansion CV - RRR/normal S1 and S2; no murmurs, rubs or gallops;  PMI nondisplaced Abdomen -NT/ND, no HSM, no mass, + bowel sounds, no bruit 2+ femoral pulses, no bruits Ext-no edema, chords, 2+ DP Neuro-grossly nonfocal  ECG    This encounter was created in error - please disregard.

## 2015-01-28 ENCOUNTER — Encounter: Payer: Self-pay | Admitting: Cardiology

## 2015-05-13 ENCOUNTER — Encounter: Payer: Managed Care, Other (non HMO) | Admitting: Family Medicine

## 2015-05-14 ENCOUNTER — Encounter: Payer: Managed Care, Other (non HMO) | Admitting: Family Medicine

## 2015-08-08 ENCOUNTER — Emergency Department (INDEPENDENT_AMBULATORY_CARE_PROVIDER_SITE_OTHER)
Admission: EM | Admit: 2015-08-08 | Discharge: 2015-08-08 | Disposition: A | Discharge status: Home / Self Care | Payer: Managed Care, Other (non HMO) | Source: Home / Self Care | Attending: Emergency Medicine | Admitting: Emergency Medicine

## 2015-08-08 ENCOUNTER — Encounter: Payer: Self-pay | Admitting: Emergency Medicine

## 2015-08-08 DIAGNOSIS — J029 Acute pharyngitis, unspecified: Secondary | ICD-10-CM

## 2015-08-08 LAB — POCT RAPID STREP A (OFFICE): Rapid Strep A Screen: NEGATIVE

## 2015-08-08 MED ORDER — AMOXICILLIN 875 MG PO TABS: ORAL_TABLET | ORAL | Status: DC

## 2015-08-08 NOTE — ED Provider Notes (Signed)
CSN: [id number]     Arrival date & time 08/08/15  1116 History   First MD Initiated Contact with Patient 08/08/15 1216     Chief Complaint  Patient presents with  . Sore Throat   (Consider location/radiation/quality/duration/timing/severity/associated sxs/prior Treatment) HPI SORE THROAT Onset: 2 days    Severity: moderate Tried OTC meds without significant relief.  Symptoms:  + Fever and chills  + Swollen neck glands + Recent Strep Exposure     mild Myalgias mild Headache No Rash  No Discolored Nasal Mucus No Allergy symptoms No sinus pain/pressure No itchy/red eyes No earache  No Drooling No Trismus  Had mild Nausea, resolved No Vomiting No Abdominal pain No Diarrhea No Reflux symptoms  Minimal dry Cough No Breathing Difficulty No Shortness of Breath No pleuritic pain No Wheezing No Hemoptysis    Past Medical History  Diagnosis Date  . Hyperlipidemia    Past Surgical History  Procedure Laterality Date  . Cholecystectomy    . Appendectomy    . Abdominal hysterectomy     Family History  Problem Relation Age of Onset  . Cancer Mother   . Heart attack Mother   . Hyperlipidemia Mother   . Hypertension Mother   . Stroke Mother   . Cancer Maternal Grandmother   . Hyperlipidemia Father   . Hypertension Father    Social History  Substance Use Topics  . Smoking status: Never Smoker   . Smokeless tobacco: None  . Alcohol Use: No   OB History    No data available     Review of Systems  All other systems reviewed and are negative. except for noted in HPI  Allergies  Review of patient's allergies indicates not on file.  Home Medications   Prior to Admission medications   Medication Sig Start Date End Date Taking? Authorizing Provider  amoxicillin (AMOXIL) 875 MG tablet Take 1 twice a day X 10 days. 08/08/15   Jacqulyn Cane, MD  cyclobenzaprine (FLEXERIL) 10 MG tablet Take 10 mg by mouth daily.    Historical Provider, MD  ondansetron  (ZOFRAN ODT) 4 MG disintegrating tablet Take one tab by mouth Q6hr prn nausea 01/06/15   Kandra Nicolas, MD  pantoprazole (PROTONIX) 40 MG tablet Take 1 tablet (40 mg total) by mouth daily. 08/02/14   Jade L Breeback, PA-C  rosuvastatin (CRESTOR) 20 MG tablet Take 1 tablet (20 mg total) by mouth daily. 08/09/14   Jade L Breeback, PA-C  sucralfate (CARAFATE) 1 G tablet Take 1 tablet (1 g total) by mouth 4 (four) times daily. 08/02/14   Jade L Breeback, PA-C  zolpidem (AMBIEN) 10 MG tablet Take 10 mg by mouth at bedtime.    Historical Provider, MD   Meds Ordered and Administered this Visit  Medications - No data to display  BP 123/88 mmHg  Pulse 115  Temp(Src) 98.6 F (37 C) (Oral)  Ht 5' (1.524 m)  Wt 190 lb (86.183 kg)  BMI 37.11 kg/m2  SpO2 96% No data found.   Physical Exam  Constitutional: She is oriented to person, place, and time. She appears well-developed and well-nourished.  Non-toxic appearance. She appears ill. No distress.  HENT:  Head: Normocephalic and atraumatic.  Right Ear: Tympanic membrane, external ear and ear canal normal.  Left Ear: Tympanic membrane, external ear and ear canal normal.  Nose: Nose normal. Right sinus exhibits no maxillary sinus tenderness and no frontal sinus tenderness. Left sinus exhibits no maxillary sinus tenderness and no frontal sinus  tenderness.  Mouth/Throat: Uvula is midline and mucous membranes are normal. No oral lesions. Posterior oropharyngeal erythema present. No oropharyngeal exudate or tonsillar abscesses.  + Tonsillar enlargement.  Airway intact.  Eyes: Conjunctivae are normal. No scleral icterus.  Neck: Neck supple.  Cardiovascular: Normal rate, regular rhythm and normal heart sounds.   No murmur heard. Pulmonary/Chest: Effort normal and breath sounds normal. No stridor. No respiratory distress. She has no wheezes. She has no rhonchi. She has no rales.  Abdominal: Soft. She exhibits no mass. There is no hepatosplenomegaly.  There is no tenderness.  Lymphadenopathy:    She has cervical adenopathy.       Right cervical: Superficial cervical adenopathy present. No deep cervical and no posterior cervical adenopathy present.      Left cervical: Superficial cervical adenopathy present. No deep cervical and no posterior cervical adenopathy present.  Neurological: She is alert and oriented to person, place, and time.  Skin: Skin is warm. No rash noted.  Psychiatric: She has a normal mood and affect.  Nursing note and vitals reviewed.   ED Course  Procedures (including critical care time)  Labs Review Labs Reviewed  STREP A DNA PROBE   Narrative:    Performed at:  [facility], [address]                [place], [place] [address]  rapid strep test neg  Imaging Review No results found.   Visual Acuity Review  Right Eye Distance:   Left Eye Distance:   Bilateral Distance:    Right Eye Near:   Left Eye Near:    Bilateral Near:         MDM   1. Acute pharyngitis, unspecified etiology    right> L  tonsillitis without exudate. Associated with right anterior cervical adenopathy.  Treatment options discussed, as well as risks, benefits, alternatives. Patient voiced understanding and agreement with the following plans:   Discharge Medication List as of 08/08/2015 12:20 PM    START taking these medications   Details  amoxicillin (AMOXIL) 875 MG tablet Take 1 twice a day X 10 days., Normal       Strep culture sent.  Other sx care  Precautions discussed. Red flags discussed. Questions invited and answered. Patient voiced understanding and agreement.    Jacqulyn Cane, MD 08/10/15 [phone]

## 2015-08-08 NOTE — ED Notes (Signed)
Sore throat, body aches, headache, fever, chills, dry cough x 3 days

## 2015-08-09 LAB — STREP A DNA PROBE: GASP: NEGATIVE

## 2015-08-10 ENCOUNTER — Telehealth: Payer: Self-pay | Admitting: Emergency Medicine

## 2015-08-13 ENCOUNTER — Encounter: Payer: PRIVATE HEALTH INSURANCE | Admitting: Physician Assistant

## 2015-08-19 ENCOUNTER — Encounter: Payer: PRIVATE HEALTH INSURANCE | Admitting: Physician Assistant

## 2016-01-09 ENCOUNTER — Emergency Department (INDEPENDENT_AMBULATORY_CARE_PROVIDER_SITE_OTHER): Payer: PRIVATE HEALTH INSURANCE

## 2016-01-09 ENCOUNTER — Emergency Department (INDEPENDENT_AMBULATORY_CARE_PROVIDER_SITE_OTHER)
Admission: EM | Admit: 2016-01-09 | Discharge: 2016-01-09 | Disposition: A | Discharge status: Home / Self Care | Payer: PRIVATE HEALTH INSURANCE | Source: Home / Self Care | Attending: Family Medicine | Admitting: Family Medicine

## 2016-01-09 ENCOUNTER — Encounter: Payer: Self-pay | Admitting: *Deleted

## 2016-01-09 DIAGNOSIS — R109 Unspecified abdominal pain: Secondary | ICD-10-CM | POA: Diagnosis not present

## 2016-01-09 DIAGNOSIS — R05 Cough: Secondary | ICD-10-CM | POA: Diagnosis not present

## 2016-01-09 DIAGNOSIS — R079 Chest pain, unspecified: Secondary | ICD-10-CM | POA: Diagnosis not present

## 2016-01-09 LAB — POCT CBC W AUTO DIFF (K'VILLE URGENT CARE)

## 2016-01-09 LAB — POCT URINALYSIS DIP (MANUAL ENTRY)
Bilirubin, UA: NEGATIVE
GLUCOSE UA: NEGATIVE
Ketones, POC UA: NEGATIVE
NITRITE UA: NEGATIVE
Protein Ur, POC: NEGATIVE
Spec Grav, UA: 1.025 (ref 1.005–1.03)
UROBILINOGEN UA: 0.2 (ref 0–1)
pH, UA: 5 (ref 5–8)

## 2016-01-09 MED ORDER — BENZONATATE 200 MG PO CAPS: 200.0000 mg | ORAL_CAPSULE | Freq: Every day | ORAL | Status: DC

## 2016-01-09 MED ORDER — HYDROCODONE-ACETAMINOPHEN 5-325 MG PO TABS: 1.0000 | ORAL_TABLET | ORAL | Status: DC | PRN

## 2016-01-09 MED ORDER — LEVOFLOXACIN 500 MG PO TABS: 500.0000 mg | ORAL_TABLET | Freq: Every day | ORAL | Status: DC

## 2016-01-09 MED ORDER — KETOROLAC TROMETHAMINE 60 MG/2ML IM SOLN
60.0000 mg | Freq: Once | INTRAMUSCULAR | Status: AC
  Administered 2016-01-09: 60 mg via INTRAMUSCULAR

## 2016-01-09 MED ORDER — ONDANSETRON 4 MG PO TBDP: 4.0000 mg | ORAL_TABLET | Freq: Three times a day (TID) | ORAL | Status: DC | PRN

## 2016-01-09 NOTE — ED Provider Notes (Signed)
CSN: [id number]     Arrival date & time 01/09/16  1059 History   First MD Initiated Contact with Patient 01/09/16 1135     Chief Complaint  Patient presents with  . Flank Pain      HPI Comments: Three days ago patient developed URI symptoms with non-productive cough and sinus congestion.  48 hours ago she developed left flank pain without urinary symptoms.  Yesterday she developed cold sweats.   The history is provided by the patient.    Past Medical History  Diagnosis Date  . Hyperlipidemia    Past Surgical History  Procedure Laterality Date  . Cholecystectomy    . Appendectomy    . Abdominal hysterectomy     Family History  Problem Relation Age of Onset  . Cancer Mother   . Heart attack Mother   . Hyperlipidemia Mother   . Hypertension Mother   . Stroke Mother   . Cancer Maternal Grandmother   . Hyperlipidemia Father   . Hypertension Father    Social History  Substance Use Topics  . Smoking status: Never Smoker   . Smokeless tobacco: None  . Alcohol Use: No   OB History    No data available     Review of Systems + sore throat + cough No pleuritic pain No wheezing + nasal congestion + post-nasal drainage No sinus pain/pressure No itchy/red eyes No earache No hemoptysis No SOB No fever, + chills/sweats + nausea + vomiting, resolved No abdominal pain; + left flank pain No diarrhea No urinary symptoms No skin rash + fatigue No myalgias No headache Used OTC meds without relief  Allergies  Review of patient's allergies indicates no known allergies.  Home Medications   Prior to Admission medications   Medication Sig Start Date End Date Taking? Authorizing Provider  cyclobenzaprine (FLEXERIL) 10 MG tablet Take 10 mg by mouth daily.   Yes Historical Provider, MD  zolpidem (AMBIEN) 10 MG tablet Take 10 mg by mouth at bedtime.   Yes Historical Provider, MD  benzonatate (TESSALON) 200 MG capsule Take 1 capsule (200 mg total) by mouth at bedtime. Take  as needed for cough 01/09/16   Kandra Nicolas, MD  HYDROcodone-acetaminophen (NORCO/VICODIN) 5-325 MG tablet Take 1 tablet by mouth every 4 (four) hours as needed for severe pain. 01/09/16   Kandra Nicolas, MD  levofloxacin (LEVAQUIN) 500 MG tablet Take 1 tablet (500 mg total) by mouth daily. 01/09/16   Kandra Nicolas, MD   Meds Ordered and Administered this Visit   Medications  ketorolac (TORADOL) injection 60 mg (60 mg Intramuscular Given 01/09/16 1220)    BP 145/88 mmHg  Pulse 74  Temp(Src) 97.9 F (36.6 C) (Oral)  Wt 188 lb (85.276 kg)  SpO2 99% No data found.   Physical Exam  Constitutional: She is oriented to person, place, and time. She appears well-developed and well-nourished. No distress.  HENT:  Head: Normocephalic.  Right Ear: Tympanic membrane normal.  Left Ear: Tympanic membrane normal.  Nose: Nose normal.  Mouth/Throat: Oropharynx is clear and moist.  Eyes: Conjunctivae are normal. Pupils are equal, round, and reactive to light.  Neck: Neck supple.  Enlarged posterior/lateral cervical nodes are palpated.  Cardiovascular: Normal heart sounds.   Pulmonary/Chest: Breath sounds normal.      Left chest has tenderness to palpation as noted on diagram.  The tenderness extends to flank, but is primarily on the lateral chest/ribs.    Abdominal: There is no tenderness.  Musculoskeletal: She  exhibits no edema or tenderness.  Neurological: She is alert and oriented to person, place, and time.  Skin: Skin is warm and dry. No rash noted.  Nursing note and vitals reviewed.   ED Course  Procedures none    Labs Reviewed: POCT CBC:  WBC 10.4; LY 31.8; MO 4.5; GR 63.7; Hgb 15.7; Platelets 327 POCT dipstick urinalysis:  BLO trace intact; LEU trace; otherwise negative   Imaging Review Dg Chest 2 View  01/09/2016  CLINICAL DATA:  Cough for 3 days, left lateral chest pain EXAM: CHEST  2 VIEW COMPARISON:  07/31/2014 FINDINGS: The heart size and mediastinal contours are  within normal limits. Both lungs are clear. The visualized skeletal structures are unremarkable. IMPRESSION: No active cardiopulmonary disease. Electronically Signed   By: Lahoma Crocker M.D.   On: 01/09/2016 12:28      MDM   1. Left flank pain (concern for possible early herpes zoster) 2.  Viral URI with cough   Urine culture pending Begin empiric Levaquin.  Rx for Lortab for pain. Prescription written for Benzonatate [facility]) to take at bedtime for night-time cough.  Take plain guaifenesin (1200mg  extended release tabs such as Mucinex) twice daily, with plenty of water, for cough and congestion.  May add Pseudoephedrine (30mg , one or two every 4 to 6 hours) for sinus congestion.  Get adequate rest.   May use Afrin nasal spray (or generic oxymetazoline) twice daily for about 5 days and then discontinue.  Also recommend using saline nasal spray several times daily and saline nasal irrigation (AYR is a common brand).  Try warm salt water gargles for sore throat.  Stop all antihistamines for now, and other non-prescription cough/cold preparations. May take Ibuprofen 200mg , 4 tabs every 8 hours with food for chest/flank pain Call if rash develops (would begin Valtrex) Followup with Family Doctor if not improved in 4 to 5 days. If symptoms become significantly worse during the night or over the weekend, proceed to the local emergency room.     Kandra Nicolas, MD 01/13/16 2052

## 2016-01-09 NOTE — ED Notes (Signed)
Pt c/o left flank pain x 2 days. She is currently sick with a URI with a lot of coughing. Denies any urinary abnormalities. Taken Aleve @ 930am

## 2016-01-09 NOTE — Discharge Instructions (Signed)
Take plain guaifenesin (1200mg  extended release tabs such as Mucinex) twice daily, with plenty of water, for cough and congestion.  May add Pseudoephedrine (30mg , one or two every 4 to 6 hours) for sinus congestion.  Get adequate rest.   May use Afrin nasal spray (or generic oxymetazoline) twice daily for about 5 days and then discontinue.  Also recommend using saline nasal spray several times daily and saline nasal irrigation (AYR is a common brand).  Try warm salt water gargles for sore throat.  Stop all antihistamines for now, and other non-prescription cough/cold preparations. May take Ibuprofen 200mg , 4 tabs every 8 hours with food for chest/flank pain Call if rash develops If symptoms become significantly worse during the night or over the weekend, proceed to the local emergency room.

## 2016-01-11 ENCOUNTER — Telehealth: Payer: Self-pay | Admitting: Emergency Medicine

## 2016-01-11 LAB — URINE CULTURE: Colony Count: 60000

## 2016-03-12 ENCOUNTER — Encounter: Payer: Self-pay | Admitting: Physician Assistant

## 2016-03-12 ENCOUNTER — Ambulatory Visit (INDEPENDENT_AMBULATORY_CARE_PROVIDER_SITE_OTHER): Payer: 59 | Admitting: Physician Assistant

## 2016-03-12 VITALS — BP 154/89 | HR 86 | Ht 60.0 in | Wt 179.0 lb

## 2016-03-12 DIAGNOSIS — F430 Acute stress reaction: Secondary | ICD-10-CM | POA: Insufficient documentation

## 2016-03-12 DIAGNOSIS — G4700 Insomnia, unspecified: Secondary | ICD-10-CM

## 2016-03-12 DIAGNOSIS — F4323 Adjustment disorder with mixed anxiety and depressed mood: Secondary | ICD-10-CM | POA: Insufficient documentation

## 2016-03-12 MED ORDER — ZOLPIDEM TARTRATE 10 MG PO TABS: 10.0000 mg | ORAL_TABLET | Freq: Every day | ORAL | Status: DC

## 2016-03-12 MED ORDER — ALPRAZOLAM 0.5 MG PO TABS: 0.5000 mg | ORAL_TABLET | Freq: Every evening | ORAL | Status: DC | PRN

## 2016-03-12 NOTE — Progress Notes (Signed)
   Subjective:    Patient ID: Janice [name], female    DOB: 18-Apr-1971, 45 y.o.   MRN: [id number]  HPI Patient is a 45 year old female who presents to the clinic with acute anxiety and depression. On May 11 her husband of 22 years left her. He cleaned out they're checking account and took both cars. Per patient he stated that" he would make sure she didn't have any money and lose her job". Patient did not report any warning signs that he was unhappy. This is less hurt though the stated. She does live in a house that is paid for that her mother gave them. He did tell her that she was very unattractive, fat, didn't amount to anything. She is struggling to comprehend how all this happened. She does have a good support system with her friends, church, work family. She denies any suicidal or homicidal thoughts. She's having trouble sleeping and she is crying uncontrollably. She's never had a history of depression or anxiety. She does have a 31 year old son who is supportive. She does have a new grandbaby due in October.   Review of Systems  All other systems reviewed and are negative.      Objective:   Physical Exam  Constitutional: She is oriented to person, place, and time. She appears well-developed and well-nourished.  HENT:  Head: Normocephalic and atraumatic.  Cardiovascular: Normal rate, regular rhythm and normal heart sounds.   Pulmonary/Chest: Effort normal and breath sounds normal.  Neurological: She is alert and oriented to person, place, and time.  Skin: Skin is dry.  Psychiatric:  Uncontrollable crying.           Assessment & Plan:  Emotional Stress acute reaction/adjustment disorder- PHQ-9 was 21. GAD-7 was 19. Xanax given as needed up to twice a day. ambien refilled. Will hold off on daily SSRI since never have hx of anxiety or depression. Pt is going to contact company for counseling provided for free through company. Written out of work from 5/12-6/2. Pt has not been  emotionally stable and could not perform job duties. She will go back on Monday. Discussed walking and making list of all the "good things" in life. Encouraged positive thinking. I want her to spend this time focusing on herself. Discussed losing weight and doing things for herself. Follow up in 2 weeks.   Spent 30 minutes with patient and greater than 50 percent spent counseling patient.

## 2016-03-26 ENCOUNTER — Ambulatory Visit: Payer: PRIVATE HEALTH INSURANCE | Admitting: Physician Assistant

## 2016-03-29 ENCOUNTER — Ambulatory Visit: Payer: PRIVATE HEALTH INSURANCE | Admitting: Physician Assistant

## 2016-03-30 ENCOUNTER — Ambulatory Visit (INDEPENDENT_AMBULATORY_CARE_PROVIDER_SITE_OTHER): Payer: 59 | Admitting: Physician Assistant

## 2016-03-30 VITALS — BP 140/83 | HR 83 | Wt 176.0 lb

## 2016-03-30 DIAGNOSIS — I25118 Atherosclerotic heart disease of native coronary artery with other forms of angina pectoris: Secondary | ICD-10-CM | POA: Diagnosis not present

## 2016-03-30 DIAGNOSIS — F430 Acute stress reaction: Secondary | ICD-10-CM

## 2016-03-30 DIAGNOSIS — M62830 Muscle spasm of back: Secondary | ICD-10-CM | POA: Diagnosis not present

## 2016-03-30 DIAGNOSIS — Z79899 Other long term (current) drug therapy: Secondary | ICD-10-CM | POA: Diagnosis not present

## 2016-03-30 DIAGNOSIS — F4323 Adjustment disorder with mixed anxiety and depressed mood: Secondary | ICD-10-CM

## 2016-03-30 MED ORDER — ATORVASTATIN CALCIUM 40 MG PO TABS
40.0000 mg | ORAL_TABLET | Freq: Every day | ORAL | Status: DC
Start: 2016-03-30 — End: 2016-11-16

## 2016-03-30 MED ORDER — BUPROPION HCL ER (XL) 150 MG PO TB24: 150.0000 mg | ORAL_TABLET | Freq: Every day | ORAL | Status: DC

## 2016-03-30 MED ORDER — METHOCARBAMOL 500 MG PO TABS: 500.0000 mg | ORAL_TABLET | Freq: Three times a day (TID) | ORAL | Status: DC

## 2016-03-30 NOTE — Progress Notes (Addendum)
   Subjective:    Patient ID: Janice [name], female    DOB: 1971/01/11, 45 y.o.   MRN: [id number]  HPI Patient is a 45 year old female who presents to the clinic to follow-up on acute stress reaction and adjustment disorder. Her husband of 25 years left her recently. She came to me approximately 2 weeks ago very emotional and broken down. She had not been going to work and was devastated by the knees. Today she is doing much better. She has started back to work and not missing any days. She has started walking daily. She has lost 4 pounds. She is working on her diet. She wants to lose more weight. She has an appointment to get her hair done today. She has a massage scheduled for later this week. She is very encouraged by this change will bring to her life. She denies any suicidal or homicidal thoughts. She very rarely takes Xanax. She still has over half a bottle from the time it was refilled before.  She is interested in switching her muscle relaxer. Currently she takes Flexeril which has helped for a while. She wonders if she could try a new one. She knows that her muscle tightness in her back sometimes will trigger migraines. Currently she has still good control with her migraines but is interested in trying another medication.   Review of Systems  All other systems reviewed and are negative.      Objective:   Physical Exam  Constitutional: She is oriented to person, place, and time. She appears well-developed and well-nourished.  Obese.   HENT:  Head: Normocephalic and atraumatic.  Cardiovascular: Normal rate, regular rhythm and normal heart sounds.   Pulmonary/Chest: Effort normal and breath sounds normal.  Neurological: She is alert and oriented to person, place, and time.  Psychiatric: She has a normal mood and affect. Her behavior is normal.          Assessment & Plan:  Acute stress reaction/adjustment disorder mixed-pH Q9 was 13 today down from 20 and GAD-7 was 7 today down  from 16. She is doing much better and rarely using her Xanax. We did decide to add Wellbutrin daily today. We also thought this might help with her weight as well. Will follow-up in 6 weeks to make sure she is improving. Discussed side effects with patient. Encouraged exercise.  Obesity/abnormal weight gain-patient has actually lost 4 pounds since last visit 2 weeks ago. We are going to add Wellbutrin to see if this can help further her weight loss. She is walking daily but I ask her to step it up with intensity. Follow-up in 6 weeks.  Atherosclerosis of coronary artery-she carries this diagnosis but she has not been taking her statin. Reordered Lipitor 40 mg to start. We'll recheck lipid level in 3 months.  Muscle spasms-stop Flexeril will try Robaxin to see if that helps with some of her muscle tightness and headache prevention. Right now her migraines are controlled but she knows that they are willing to upper back muscle spasms.    Patient encouraged to schedule a complete physical where we can go over all of her health maintenance items.

## 2016-04-30 ENCOUNTER — Other Ambulatory Visit: Payer: Self-pay | Admitting: Physician Assistant

## 2016-05-11 ENCOUNTER — Ambulatory Visit: Payer: PRIVATE HEALTH INSURANCE | Admitting: Physician Assistant

## 2016-05-12 ENCOUNTER — Encounter: Payer: Self-pay | Admitting: Physician Assistant

## 2016-05-12 ENCOUNTER — Ambulatory Visit (INDEPENDENT_AMBULATORY_CARE_PROVIDER_SITE_OTHER): Payer: 59 | Admitting: Physician Assistant

## 2016-05-12 VITALS — BP 142/88 | HR 79 | Ht 60.0 in | Wt 170.0 lb

## 2016-05-12 DIAGNOSIS — IMO0001 Reserved for inherently not codable concepts without codable children: Secondary | ICD-10-CM

## 2016-05-12 DIAGNOSIS — R030 Elevated blood-pressure reading, without diagnosis of hypertension: Secondary | ICD-10-CM | POA: Insufficient documentation

## 2016-05-12 DIAGNOSIS — F4323 Adjustment disorder with mixed anxiety and depressed mood: Secondary | ICD-10-CM

## 2016-05-12 DIAGNOSIS — G4700 Insomnia, unspecified: Secondary | ICD-10-CM

## 2016-05-12 DIAGNOSIS — E6609 Other obesity due to excess calories: Secondary | ICD-10-CM | POA: Insufficient documentation

## 2016-05-12 DIAGNOSIS — Z6838 Body mass index (BMI) 38.0-38.9, adult: Secondary | ICD-10-CM

## 2016-05-12 DIAGNOSIS — E66812 Obesity, class 2: Secondary | ICD-10-CM | POA: Insufficient documentation

## 2016-05-12 DIAGNOSIS — M62830 Muscle spasm of back: Secondary | ICD-10-CM

## 2016-05-12 DIAGNOSIS — E669 Obesity, unspecified: Secondary | ICD-10-CM

## 2016-05-12 MED ORDER — METHOCARBAMOL 500 MG PO TABS: 500.0000 mg | ORAL_TABLET | Freq: Three times a day (TID) | ORAL | 1 refills | Status: DC

## 2016-05-12 MED ORDER — ZOLPIDEM TARTRATE 10 MG PO TABS: 10.0000 mg | ORAL_TABLET | Freq: Every day | ORAL | 5 refills | Status: DC

## 2016-05-12 MED ORDER — LISINOPRIL 10 MG PO TABS: 10.0000 mg | ORAL_TABLET | Freq: Every day | ORAL | 5 refills | Status: DC

## 2016-05-12 MED ORDER — BUPROPION HCL ER (XL) 300 MG PO TB24: 300.0000 mg | ORAL_TABLET | Freq: Every day | ORAL | 5 refills | Status: DC

## 2016-05-12 MED ORDER — ALPRAZOLAM 0.5 MG PO TABS
0.5000 mg | ORAL_TABLET | Freq: Every evening | ORAL | 0 refills | Status: DC | PRN
Start: 2016-05-12 — End: 2016-11-16

## 2016-05-12 NOTE — Progress Notes (Addendum)
   Subjective:    Patient ID: Janice [name], female    DOB: 1971-04-23, 45 y.o.   MRN: [id number]  HPI Patient is a 45 year old female that presents to clinic for follow up on acute stress reaction and adjustment disorder. She is states she is going much better than the last time she was here. She is down 6 pounds since last visit and is exercising twice a week while making diet modifications. She wants to know if she can go back on phentermine. She states Wellbutrin is working well and she has no side effects.  She reports that 2 weeks ago when she saw her ex husband moved into a new apartment with a new woman she was stayed out of work for 3 days. Since then she has been going to work. She reports recently getting a promotion.   She is taking Ambien every night and getting about 3-4 hours sleep. She reports the muscle relaxer is working well for her and she is using Xanax about twice a week.   Review of Systems  All other systems reviewed and are negative.     Objective:   Physical Exam  Constitutional: She is oriented to person, place, and time. She appears well-developed and well-nourished.  HENT:  Head: Normocephalic and atraumatic.  Cardiovascular: Normal rate, regular rhythm and normal heart sounds.   Pulmonary/Chest: Effort normal and breath sounds normal.  Neurological: She is alert and oriented to person, place, and time.  Psychiatric: She has a normal mood and affect. Her behavior is normal.      Assessment & Plan:  Acute stress reaction/adjustment disorder- PHQ-9 =9 and GAD 7 = 14 today Increase Wellbutrin to 300 mg once daily Refilled Xanax today only as needed.   Elevated Blood Pressure- Start lisinopril 10 mg daily  Follow up in 4 weeks to check BP  Obesity- Encouraged continued diet modifications and exercise Will recheck BP in 4 weeks, if BP is controlled, then will start phentermine  Muscle spasm of back- Responding well to Robaxin Refilled  today  Insomnia- Refilled Ambien

## 2016-06-10 ENCOUNTER — Ambulatory Visit: Payer: 59

## 2016-06-16 ENCOUNTER — Ambulatory Visit: Payer: 59

## 2016-06-29 ENCOUNTER — Ambulatory Visit: Payer: 59

## 2016-07-06 ENCOUNTER — Ambulatory Visit (INDEPENDENT_AMBULATORY_CARE_PROVIDER_SITE_OTHER): Payer: 59 | Admitting: Physician Assistant

## 2016-07-06 VITALS — BP 147/82 | HR 73 | Wt 173.0 lb

## 2016-07-06 DIAGNOSIS — E669 Obesity, unspecified: Secondary | ICD-10-CM

## 2016-07-06 DIAGNOSIS — R635 Abnormal weight gain: Secondary | ICD-10-CM | POA: Diagnosis not present

## 2016-07-06 MED ORDER — METHOCARBAMOL 500 MG PO TABS: 500.0000 mg | ORAL_TABLET | Freq: Three times a day (TID) | ORAL | 1 refills | Status: DC

## 2016-07-06 MED ORDER — PHENTERMINE HCL 37.5 MG PO CAPS: 37.5000 mg | ORAL_CAPSULE | ORAL | 0 refills | Status: DC

## 2016-07-06 NOTE — Progress Notes (Signed)
Twalla presents to the clinic for weight and BP check.  She is wanting to restart phentermine.  Denies having problems with medication in the past.  She is also wanting a refill of robaxin. She would like for her Rx to be sent to [facility]. Pt advised to make next appointment in 4 weeks. -[facility]MA

## 2016-07-16 ENCOUNTER — Other Ambulatory Visit: Payer: Self-pay | Admitting: *Deleted

## 2016-07-16 MED ORDER — BUPROPION HCL ER (XL) 300 MG PO TB24
300.0000 mg | ORAL_TABLET | Freq: Every day | ORAL | 0 refills | Status: DC
Start: 2016-07-16 — End: 2016-09-27

## 2016-08-03 ENCOUNTER — Ambulatory Visit: Payer: 59 | Admitting: Physician Assistant

## 2016-08-10 ENCOUNTER — Ambulatory Visit: Payer: 59 | Admitting: Physician Assistant

## 2016-08-16 ENCOUNTER — Ambulatory Visit (INDEPENDENT_AMBULATORY_CARE_PROVIDER_SITE_OTHER): Payer: 59 | Admitting: Physician Assistant

## 2016-08-16 ENCOUNTER — Encounter: Payer: Self-pay | Admitting: Physician Assistant

## 2016-08-16 VITALS — BP 137/78 | HR 78 | Ht 60.0 in | Wt 171.0 lb

## 2016-08-16 DIAGNOSIS — Z6833 Body mass index (BMI) 33.0-33.9, adult: Secondary | ICD-10-CM

## 2016-08-16 DIAGNOSIS — E6609 Other obesity due to excess calories: Secondary | ICD-10-CM | POA: Diagnosis not present

## 2016-08-16 MED ORDER — PHENTERMINE HCL 37.5 MG PO CAPS: 37.5000 mg | ORAL_CAPSULE | ORAL | 0 refills | Status: DC

## 2016-08-16 NOTE — Progress Notes (Signed)
   Subjective:    Patient ID: Janice [name], female    DOB: 03-18-1971, 45 y.o.   MRN: [id number]  HPI Pt is a 45 yo female who presents to the clinic to follow up on weight. She has lost 2lbs this month total weight loss of 19lbs. She is on phentermine. She denies any insomnia or palpitations or constipation. She feels good. She admits she is not exercising like she should.    Review of Systems  All other systems reviewed and are negative.      Objective:   Physical Exam  Constitutional: She is oriented to person, place, and time. She appears well-developed and well-nourished.  HENT:  Head: Normocephalic and atraumatic.  Cardiovascular: Normal rate, regular rhythm and normal heart sounds.   Pulmonary/Chest: Effort normal and breath sounds normal.  Neurological: She is alert and oriented to person, place, and time.  Psychiatric: She has a normal mood and affect. Her behavior is normal.          Assessment & Plan:  Marland KitchenMarland KitchenAllex was seen today for abnormal weight gain.  Diagnoses and all orders for this visit:  Class 1 obesity due to excess calories without serious comorbidity with body mass index (BMI) of 33.0 to 33.9 in adult -     phentermine 37.5 MG capsule; Take 1 capsule (37.5 mg total) by mouth every morning.  discussed need for 4lb weight loss every month. Follow up in 4 weeks.  Encouraged at least 161minutes of exercise a week.  1500 calorie diet a day.

## 2016-09-13 ENCOUNTER — Ambulatory Visit: Payer: 59

## 2016-09-18 ENCOUNTER — Other Ambulatory Visit: Payer: Self-pay | Admitting: Physician Assistant

## 2016-09-18 DIAGNOSIS — E6609 Other obesity due to excess calories: Secondary | ICD-10-CM

## 2016-09-18 DIAGNOSIS — Z6833 Body mass index (BMI) 33.0-33.9, adult: Principal | ICD-10-CM

## 2016-09-20 ENCOUNTER — Other Ambulatory Visit: Payer: Self-pay | Admitting: Physician Assistant

## 2016-09-20 DIAGNOSIS — E6609 Other obesity due to excess calories: Secondary | ICD-10-CM

## 2016-09-20 DIAGNOSIS — Z6833 Body mass index (BMI) 33.0-33.9, adult: Principal | ICD-10-CM

## 2016-09-27 ENCOUNTER — Other Ambulatory Visit: Payer: Self-pay | Admitting: Physician Assistant

## 2016-10-13 ENCOUNTER — Ambulatory Visit: Payer: 59 | Admitting: Physician Assistant

## 2016-11-08 ENCOUNTER — Other Ambulatory Visit: Payer: Self-pay | Admitting: Physician Assistant

## 2016-11-10 ENCOUNTER — Ambulatory Visit: Payer: 59 | Admitting: Physician Assistant

## 2016-11-16 ENCOUNTER — Encounter: Payer: Self-pay | Admitting: Physician Assistant

## 2016-11-16 ENCOUNTER — Ambulatory Visit (INDEPENDENT_AMBULATORY_CARE_PROVIDER_SITE_OTHER): Payer: Commercial Managed Care - PPO | Admitting: Physician Assistant

## 2016-11-16 VITALS — BP 148/91 | HR 80 | Ht 60.0 in | Wt 170.0 lb

## 2016-11-16 DIAGNOSIS — F5101 Primary insomnia: Secondary | ICD-10-CM | POA: Diagnosis not present

## 2016-11-16 DIAGNOSIS — E6609 Other obesity due to excess calories: Secondary | ICD-10-CM | POA: Diagnosis not present

## 2016-11-16 DIAGNOSIS — R030 Elevated blood-pressure reading, without diagnosis of hypertension: Secondary | ICD-10-CM | POA: Diagnosis not present

## 2016-11-16 DIAGNOSIS — J069 Acute upper respiratory infection, unspecified: Secondary | ICD-10-CM

## 2016-11-16 DIAGNOSIS — Z6833 Body mass index (BMI) 33.0-33.9, adult: Secondary | ICD-10-CM

## 2016-11-16 DIAGNOSIS — F411 Generalized anxiety disorder: Secondary | ICD-10-CM

## 2016-11-16 DIAGNOSIS — B9789 Other viral agents as the cause of diseases classified elsewhere: Secondary | ICD-10-CM

## 2016-11-16 DIAGNOSIS — E781 Pure hyperglyceridemia: Secondary | ICD-10-CM | POA: Diagnosis not present

## 2016-11-16 DIAGNOSIS — I2510 Atherosclerotic heart disease of native coronary artery without angina pectoris: Secondary | ICD-10-CM

## 2016-11-16 MED ORDER — ATORVASTATIN CALCIUM 40 MG PO TABS: 40.0000 mg | ORAL_TABLET | Freq: Every day | ORAL | 1 refills | Status: DC

## 2016-11-16 MED ORDER — LISINOPRIL 10 MG PO TABS: 10.0000 mg | ORAL_TABLET | Freq: Every day | ORAL | 5 refills | Status: DC

## 2016-11-16 MED ORDER — ZOLPIDEM TARTRATE 10 MG PO TABS: 10.0000 mg | ORAL_TABLET | Freq: Every day | ORAL | 5 refills | Status: DC

## 2016-11-16 MED ORDER — ALPRAZOLAM 0.5 MG PO TABS: 0.5000 mg | ORAL_TABLET | Freq: Every evening | ORAL | 0 refills | Status: DC | PRN

## 2016-11-16 NOTE — Progress Notes (Signed)
   Subjective:    Patient ID: Janice [name], female    DOB: 1971-05-08, 46 y.o.   MRN: [id number]  HPI Pt is a 46 yo female who presents to the clinic for 3 month follow up.   Obesity- weight is stable but she has not lost anymore weight. She would like to start back on phentermine. She really likes the energy it gives her. She is not exercising.   She needs refill on lipitor. Tolerating fine.   She is doing well on [place]. No side effects. She is sleeping well.   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 Life is good still occasionally uses xanax about twice a week. Needs refill.   She has started to have some nasal congestion, ST, ear popping for 2 days. Took sudafed this am with some relief. . No fever, chills, body aches. Cough is dry.    Review of Systems  All other systems reviewed and are negative.      Objective:   Physical Exam  Constitutional: She is oriented to person, place, and time. She appears well-developed and well-nourished.  HENT:  Head: Normocephalic and atraumatic.  Right Ear: External ear normal.  Left Ear: External ear normal.  Nose: Nose normal.  Mouth/Throat: Oropharynx is clear and moist. No oropharyngeal exudate.  Eyes: Conjunctivae are normal. Right eye exhibits no discharge. Left eye exhibits no discharge.  Neck: Normal range of motion. Neck supple.  Cardiovascular: Normal rate, regular rhythm and normal heart sounds.   Pulmonary/Chest: Effort normal and breath sounds normal. She has no wheezes.  Lymphadenopathy:    She has no cervical adenopathy.  Neurological: She is alert and oriented to person, place, and time.  Psychiatric: She has a normal mood and affect. Her behavior is normal.          Assessment & Plan:  Marland KitchenMarland KitchenGhina was seen today for depression, hypertension and anxiety.  Diagnoses and all orders for this visit:  Viral URI  Atherosclerosis of native coronary artery of native heart without angina pectoris -     atorvastatin  (LIPITOR) 40 MG tablet; Take 1 tablet (40 mg total) by mouth [name].  Class 1 obesity due to excess calories without serious comorbidity with body mass index (BMI) of 33.0 to 33.9 in adult  Elevated blood pressure reading -     lisinopril (PRINIVIL,ZESTRIL) 10 MG tablet; Take 1 tablet (10 mg total) by mouth [name].  Primary insomnia -     zolpidem (AMBIEN) 10 MG tablet; Take 1 tablet (10 mg total) by mouth at bedtime.  Hypertriglyceridemia, essential  GAD (generalized anxiety disorder) -     ALPRAZolam (XANAX) 0.5 MG tablet; Take 1 tablet (0.5 mg total) by mouth at bedtime as needed for anxiety.  discuss viral etiology and symptomatic care. Follow up as needed.   Pt did not take BP medication today and took a sudafed this am. Keep checking if still above 140/90 in 2 weeks will increase lisinopril.   Discussed phentermine is NOT used for energy. We just finished 12 week trial. She has kept weight off. Discussed other long term weight loss medications. She will hold off for now due to cost. Discussed diet and exercise.

## 2016-11-18 DIAGNOSIS — F411 Generalized anxiety disorder: Secondary | ICD-10-CM | POA: Insufficient documentation

## 2016-12-11 ENCOUNTER — Other Ambulatory Visit: Payer: Self-pay | Admitting: Physician Assistant

## 2016-12-14 ENCOUNTER — Other Ambulatory Visit: Payer: Self-pay | Admitting: *Deleted

## 2016-12-14 MED ORDER — METHOCARBAMOL 500 MG PO TABS: 500.0000 mg | ORAL_TABLET | Freq: Three times a day (TID) | ORAL | 1 refills | Status: DC

## 2016-12-31 ENCOUNTER — Ambulatory Visit (INDEPENDENT_AMBULATORY_CARE_PROVIDER_SITE_OTHER): Payer: Commercial Managed Care - PPO

## 2016-12-31 ENCOUNTER — Ambulatory Visit (INDEPENDENT_AMBULATORY_CARE_PROVIDER_SITE_OTHER): Payer: Commercial Managed Care - PPO | Admitting: Sports Medicine

## 2016-12-31 ENCOUNTER — Encounter: Payer: Self-pay | Admitting: Sports Medicine

## 2016-12-31 ENCOUNTER — Other Ambulatory Visit: Payer: Self-pay | Admitting: Physician Assistant

## 2016-12-31 DIAGNOSIS — F411 Generalized anxiety disorder: Secondary | ICD-10-CM

## 2016-12-31 DIAGNOSIS — M7711 Lateral epicondylitis, right elbow: Secondary | ICD-10-CM

## 2016-12-31 DIAGNOSIS — M25521 Pain in right elbow: Secondary | ICD-10-CM

## 2016-12-31 MED ORDER — MELOXICAM 15 MG PO TABS: ORAL_TABLET | ORAL | 3 refills | Status: DC

## 2016-12-31 NOTE — Assessment & Plan Note (Signed)
Present for 2 months, injection per patient request. Strap with compressive dressing. Rehabilitation exercises given, meloxicam, return in one month.

## 2016-12-31 NOTE — Progress Notes (Signed)
   Subjective:    I'm seeing this patient as a consultation for:  Iran Planas, PA-C  CC: Right elbow pain  HPI: Janice [name] is a pleasant 46 year old female, she works achieved. For the past 2 months she's had severe pain, localized at the common extensor tendon origin, radiation to the dorsal forearm. Worse with grasping, and extension of the wrist. She's tried oral anti-inflammatories without any improvement. At this point she desires aggressive interventional treatment today.  Past medical history:  Negative.  See flowsheet/record as well for more information.  Surgical history: Negative.  See flowsheet/record as well for more information.  Family history: Negative.  See flowsheet/record as well for more information.  Social history: Negative.  See flowsheet/record as well for more information.  Allergies, and medications have been entered into the medical record, reviewed, and no changes needed.   Review of Systems: No headache, visual changes, nausea, vomiting, diarrhea, constipation, dizziness, abdominal pain, skin rash, fevers, chills, night sweats, weight loss, swollen lymph nodes, body aches, joint swelling, muscle aches, chest pain, shortness of breath, mood changes, visual or auditory hallucinations.   Objective:   General: Well Developed, well nourished, and in no acute distress.  Neuro/Psych: Alert and oriented x3, extra-ocular muscles intact, able to move all 4 extremities, sensation grossly intact. Skin: Warm and dry, no rashes noted.  Respiratory: Not using accessory muscles, speaking in full sentences, trachea midline.  Cardiovascular: Pulses palpable, no extremity edema. Abdomen: Does not appear distended. Right Elbow: Unremarkable to inspection. Range of motion full pronation, supination, flexion, extension. Strength is full to all of the above directions Stable to varus, valgus stress. Negative moving valgus stress test. Exquisite tenderness at the common extensor  tendon origin, mild swelling. Ulnar nerve does not sublux. Negative cubital tunnel Tinel's.  Procedure: Real-time Ultrasound Guided Injection of right common extensor tendon origin Device: GE Logiq E  Verbal informed consent obtained.  Time-out conducted.  Noted no overlying erythema, induration, or other signs of local infection.  Skin prepped in a sterile fashion.  Local anesthesia: Topical Ethyl chloride.  With sterile technique and under real time ultrasound guidance:  Using a 25-gauge needle I placed medication both superficial to and deep to the origin of the common extensor tendon at the lateral upper condyle. A total of 1 mL catalog 40, 1 mL lidocaine, 1 mL bupivacaine was used. Completed without difficulty  Pain immediately resolved suggesting accurate placement of the medication.  Advised to call if fevers/chills, erythema, induration, drainage, or persistent bleeding.  Images permanently stored and available for review in the ultrasound unit.  Impression: Technically successful ultrasound guided injection.  The right elbow was then strapped with compressive dressing.  Impression and Recommendations:   This case required medical decision making of moderate complexity.  Right lateral epicondylitis Present for 2 months, injection per patient request. Strap with compressive dressing. Rehabilitation exercises given, meloxicam, return in one month.

## 2017-01-31 ENCOUNTER — Ambulatory Visit: Payer: Commercial Managed Care - PPO | Admitting: Sports Medicine

## 2017-02-14 ENCOUNTER — Other Ambulatory Visit: Payer: Self-pay | Admitting: Physician Assistant

## 2017-02-14 DIAGNOSIS — F411 Generalized anxiety disorder: Secondary | ICD-10-CM

## 2017-03-15 ENCOUNTER — Other Ambulatory Visit: Payer: Self-pay | Admitting: Physician Assistant

## 2017-04-14 ENCOUNTER — Other Ambulatory Visit: Payer: Self-pay

## 2017-04-14 ENCOUNTER — Telehealth: Payer: Self-pay | Admitting: Physician Assistant

## 2017-04-14 MED ORDER — BUPROPION HCL ER (XL) 300 MG PO TB24: 300.0000 mg | ORAL_TABLET | Freq: Every day | ORAL | 0 refills | Status: DC

## 2017-04-14 NOTE — Telephone Encounter (Signed)
Refill sent to [facility] -EH/RMA

## 2017-04-14 NOTE — Telephone Encounter (Signed)
Pt called.  She no longer wants to receive her Wellbutrin by mail instead she wants it sent to [facility]/phone number [phone]. She is also out of this med.  Thank you.

## 2017-05-15 ENCOUNTER — Other Ambulatory Visit: Payer: Self-pay | Admitting: Physician Assistant

## 2017-05-15 DIAGNOSIS — F411 Generalized anxiety disorder: Secondary | ICD-10-CM

## 2017-05-16 ENCOUNTER — Other Ambulatory Visit: Payer: Self-pay | Admitting: Physician Assistant

## 2017-05-16 DIAGNOSIS — F411 Generalized anxiety disorder: Secondary | ICD-10-CM

## 2017-05-17 ENCOUNTER — Telehealth: Payer: Self-pay

## 2017-05-17 DIAGNOSIS — F5101 Primary insomnia: Secondary | ICD-10-CM

## 2017-05-17 DIAGNOSIS — F411 Generalized anxiety disorder: Secondary | ICD-10-CM

## 2017-05-17 MED ORDER — ZOLPIDEM TARTRATE 10 MG PO TABS: 10.0000 mg | ORAL_TABLET | Freq: Every day | ORAL | 0 refills | Status: DC

## 2017-05-17 MED ORDER — ALPRAZOLAM 0.5 MG PO TABS: 0.5000 mg | ORAL_TABLET | Freq: Every evening | ORAL | 0 refills | Status: DC | PRN

## 2017-05-17 NOTE — Telephone Encounter (Signed)
Patient request refill for Ambien 10 mg and Xanax 0.5 mg. Patient verbally understood that she will need to follow uo with PCP for further refills and she was transferred to scheduling to make that appointment. Keron Koffman,CMA

## 2017-05-24 ENCOUNTER — Ambulatory Visit: Payer: Commercial Managed Care - PPO | Admitting: Physician Assistant

## 2017-05-25 ENCOUNTER — Ambulatory Visit: Payer: Commercial Managed Care - PPO | Admitting: Physician Assistant

## 2017-06-01 ENCOUNTER — Ambulatory Visit: Payer: Commercial Managed Care - PPO | Admitting: Physician Assistant

## 2017-06-01 DIAGNOSIS — Z0189 Encounter for other specified special examinations: Secondary | ICD-10-CM

## 2017-06-07 ENCOUNTER — Encounter: Payer: Self-pay | Admitting: Physician Assistant

## 2017-06-07 ENCOUNTER — Ambulatory Visit (INDEPENDENT_AMBULATORY_CARE_PROVIDER_SITE_OTHER): Payer: Commercial Managed Care - PPO | Admitting: Physician Assistant

## 2017-06-07 VITALS — BP 132/86 | HR 82 | Temp 97.9°F | Wt 189.0 lb

## 2017-06-07 DIAGNOSIS — J019 Acute sinusitis, unspecified: Secondary | ICD-10-CM | POA: Diagnosis not present

## 2017-06-07 DIAGNOSIS — I2510 Atherosclerotic heart disease of native coronary artery without angina pectoris: Secondary | ICD-10-CM

## 2017-06-07 DIAGNOSIS — Z131 Encounter for screening for diabetes mellitus: Secondary | ICD-10-CM | POA: Diagnosis not present

## 2017-06-07 DIAGNOSIS — Z6833 Body mass index (BMI) 33.0-33.9, adult: Secondary | ICD-10-CM

## 2017-06-07 DIAGNOSIS — Z13220 Encounter for screening for lipoid disorders: Secondary | ICD-10-CM

## 2017-06-07 DIAGNOSIS — F5101 Primary insomnia: Secondary | ICD-10-CM

## 2017-06-07 DIAGNOSIS — F411 Generalized anxiety disorder: Secondary | ICD-10-CM

## 2017-06-07 DIAGNOSIS — R030 Elevated blood-pressure reading, without diagnosis of hypertension: Secondary | ICD-10-CM | POA: Diagnosis not present

## 2017-06-07 DIAGNOSIS — E6609 Other obesity due to excess calories: Secondary | ICD-10-CM

## 2017-06-07 DIAGNOSIS — B9689 Other specified bacterial agents as the cause of diseases classified elsewhere: Secondary | ICD-10-CM | POA: Diagnosis not present

## 2017-06-07 DIAGNOSIS — M62830 Muscle spasm of back: Secondary | ICD-10-CM | POA: Diagnosis not present

## 2017-06-07 DIAGNOSIS — F4323 Adjustment disorder with mixed anxiety and depressed mood: Secondary | ICD-10-CM | POA: Diagnosis not present

## 2017-06-07 MED ORDER — AMOXICILLIN-POT CLAVULANATE 875-125 MG PO TABS: 1.0000 | ORAL_TABLET | Freq: Two times a day (BID) | ORAL | 0 refills | Status: DC

## 2017-06-07 MED ORDER — ATORVASTATIN CALCIUM 40 MG PO TABS: 40.0000 mg | ORAL_TABLET | Freq: Every day | ORAL | 1 refills | Status: DC

## 2017-06-07 MED ORDER — BUPROPION HCL ER (XL) 300 MG PO TB24: 300.0000 mg | ORAL_TABLET | Freq: Every day | ORAL | 1 refills | Status: DC

## 2017-06-07 MED ORDER — ALPRAZOLAM 0.5 MG PO TABS: 0.5000 mg | ORAL_TABLET | Freq: Two times a day (BID) | ORAL | 5 refills | Status: DC | PRN

## 2017-06-07 MED ORDER — LISINOPRIL 10 MG PO TABS: 10.0000 mg | ORAL_TABLET | Freq: Every day | ORAL | 5 refills | Status: DC

## 2017-06-07 MED ORDER — ZOLPIDEM TARTRATE 10 MG PO TABS: 10.0000 mg | ORAL_TABLET | Freq: Every day | ORAL | 5 refills | Status: DC

## 2017-06-07 NOTE — Patient Instructions (Signed)
..  Discussed low carb diet with 1500 calories and 80g of protein.  Exercising at least 150 minutes a week.  My Fitness Pal could be a great resource.   

## 2017-06-07 NOTE — Progress Notes (Signed)
Subjective:    Patient ID: Janice [name], female    DOB: [birth year], 46 y.o.   MRN: [id number]  HPI Pt is a 46 yo female who presents to the clinic for medication refills.   GAD- doing ok but anxiety is current worse due to work. She is having to pick up extra shifts. She is also having to work 3rd shift which is hard on her. She continues to have muscle spasms and carries a lot of stress in her neck. Taking wellbutrin and as needed xanax. Admits to taking xanax at least once a day.   She had done sowell and lost quite a bit of weight and then she gained it all back.   HTN- denies any CP, palpitations, headaches, or vision changes.   Insomnia- controlled with ambien.   For the past 3 weeks she has had ongoing URI symptoms with dry cough, running nose, ears popping, off and on ST. She is now getting more headaches, facial pain, congestion. She has tried mucinex D without relief.   .. Active Ambulatory Problems    Diagnosis Date Noted  . MIGRAINE HEADACHE 11/03/2010  . Insomnia 02/26/2014  . Hypertriglyceridemia, essential 02/27/2014  . Atherosclerosis of coronary artery 08/02/2014  . Fatty liver disease, nonalcoholic 83/41/9622  . Adenoma of right adrenal gland 08/02/2014  . H. pylori infection 08/05/2014  . Abnormal stress test 10/30/2014  . Adjustment disorder with mixed anxiety and depressed mood 03/12/2016  . Acute stress reaction 03/12/2016  . Emotional crisis as acute reaction to exceptional (gross) stress 03/12/2016  . Muscle spasm of back 03/30/2016  . Class 1 obesity due to excess calories without serious comorbidity with body mass index (BMI) of 33.0 to 33.9 in adult 05/12/2016  . Elevated blood pressure reading 05/12/2016  . GAD (generalized anxiety disorder) 11/18/2016  . Right lateral epicondylitis 12/31/2016   Resolved Ambulatory Problems    Diagnosis Date Noted  . No Resolved Ambulatory Problems   Past Medical History:  Diagnosis Date  . Hyperlipidemia          Review of Systems  All other systems reviewed and are negative.      Objective:   Physical Exam  Constitutional: She is oriented to person, place, and time. She appears well-developed and well-nourished.  Obese.   HENT:  Head: Normocephalic and atraumatic.  Right Ear: External ear normal.  Left Ear: External ear normal.  Nose: Nose normal.  Mouth/Throat: Oropharynx is clear and moist. No oropharyngeal exudate.  TM's clear.  Tenderness along maxillary sinuses to palpation.   Eyes: Conjunctivae are normal.  Cardiovascular: Normal rate, regular rhythm and normal heart sounds.   Pulmonary/Chest: Effort normal and breath sounds normal. She has no wheezes.  Lymphadenopathy:    She has cervical adenopathy.  Neurological: She is alert and oriented to person, place, and time.  Psychiatric: She has a normal mood and affect. Her behavior is normal.          Assessment & Plan:  Marland KitchenMarland KitchenAyah was seen today for medication refill and cough.  Diagnoses and all orders for this visit:  GAD (generalized anxiety disorder) -     ALPRAZolam (XANAX) 0.5 MG tablet; Take 1 tablet (0.5 mg total) by mouth 2 (two) times daily as needed for anxiety. -     buPROPion (WELLBUTRIN XL) 300 MG 24 hr tablet; Take 1 tablet (300 mg total) by mouth daily.  Atherosclerosis of native coronary artery of native heart without angina pectoris -  atorvastatin (LIPITOR) 40 MG tablet; Take 1 tablet (40 mg total) by mouth daily. -     Lipid panel  Elevated blood pressure reading -     lisinopril (PRINIVIL,ZESTRIL) 10 MG tablet; Take 1 tablet (10 mg total) by mouth daily.  Primary insomnia -     zolpidem (AMBIEN) 10 MG tablet; Take 1 tablet (10 mg total) by mouth at bedtime.  Screening for lipid disorders  Screening for diabetes mellitus -     COMPLETE METABOLIC PANEL WITH GFR  Acute bacterial sinusitis -     amoxicillin-clavulanate (AUGMENTIN) 875-125 MG tablet; Take 1 tablet by mouth 2 (two)  times daily. For 10 days.  Other orders -     Cancel: methocarbamol (ROBAXIN) 500 MG tablet; Take 1 tablet (500 mg total) by mouth 3 (three) times daily.    Depression screen [facility] 2/9 06/07/2017 05/12/2016 03/30/2016 02/26/2014  Decreased Interest 1 1 1  0  Down, Depressed, Hopeless 1 1 1  0  PHQ - 2 Score 2 2 2  0  Altered sleeping 3 2 3  -  Tired, decreased energy 3 2 3  -  Change in appetite 3 1 3  -  Feeling bad or failure about yourself  1 1 1  -  Trouble concentrating 1 1 0 -  Moving slowly or fidgety/restless 0 0 0 -  Suicidal thoughts 0 0 1 -  PHQ-9 Score 13 9 13  -  Difficult doing work/chores - - Somewhat difficult -   .. GAD 7 : Generalized Anxiety Score 06/07/2017 05/12/2016 03/30/2016  Nervous, Anxious, on Edge 1 2 1   Control/stop worrying 1 2 1   Worry too much - different things 1 2 1   Trouble relaxing 1 2 1   Restless 1 2 1   Easily annoyed or irritable 1 2 1   Afraid - awful might happen 0 2 1  Total GAD 7 Score 6 14 7   Anxiety Difficulty Not difficult at all Somewhat difficult Somewhat difficult    Last had phentermine filled.  11/17. Discussed I would like to get her mood more under control so she can focus on weight loss. Will hold on phentermine at this time. She has tried it and lost weight but gained it back. I discussed this is not what we want to see.   Continue on wellbutrin. Discussed massage therapy for neck pain.   [name].Discussed low carb diet with 1500 calories and 80g of protein.  Exercising at least 150 minutes a week.  [employer] could be a Microbiologist.   Symptomatic care discussed with patient. Follow up if not improving.

## 2017-06-14 ENCOUNTER — Other Ambulatory Visit: Payer: Self-pay | Admitting: Physician Assistant

## 2017-07-05 ENCOUNTER — Ambulatory Visit: Payer: Commercial Managed Care - PPO | Admitting: Sports Medicine

## 2017-07-07 ENCOUNTER — Ambulatory Visit: Payer: Commercial Managed Care - PPO | Admitting: Sports Medicine

## 2017-07-07 DIAGNOSIS — Z0189 Encounter for other specified special examinations: Secondary | ICD-10-CM

## 2017-07-18 ENCOUNTER — Encounter: Payer: Self-pay | Admitting: Sports Medicine

## 2017-07-18 ENCOUNTER — Ambulatory Visit: Payer: Commercial Managed Care - PPO | Admitting: Sports Medicine

## 2017-07-18 DIAGNOSIS — Z0189 Encounter for other specified special examinations: Secondary | ICD-10-CM

## 2017-09-05 ENCOUNTER — Other Ambulatory Visit: Payer: Self-pay | Admitting: Physician Assistant

## 2017-11-15 ENCOUNTER — Ambulatory Visit: Payer: Commercial Managed Care - PPO | Admitting: Physician Assistant

## 2017-11-18 ENCOUNTER — Ambulatory Visit: Payer: Self-pay | Admitting: Physician Assistant

## 2017-11-18 DIAGNOSIS — Z0189 Encounter for other specified special examinations: Secondary | ICD-10-CM

## 2017-11-23 ENCOUNTER — Ambulatory Visit (INDEPENDENT_AMBULATORY_CARE_PROVIDER_SITE_OTHER): Payer: Self-pay | Admitting: Physician Assistant

## 2017-11-23 ENCOUNTER — Encounter: Payer: Self-pay | Admitting: Physician Assistant

## 2017-11-23 VITALS — BP 136/70 | HR 111 | Ht 60.0 in | Wt 184.0 lb

## 2017-11-23 DIAGNOSIS — R1012 Left upper quadrant pain: Secondary | ICD-10-CM

## 2017-11-23 DIAGNOSIS — B9789 Other viral agents as the cause of diseases classified elsewhere: Secondary | ICD-10-CM

## 2017-11-23 DIAGNOSIS — R030 Elevated blood-pressure reading, without diagnosis of hypertension: Secondary | ICD-10-CM

## 2017-11-23 DIAGNOSIS — Z131 Encounter for screening for diabetes mellitus: Secondary | ICD-10-CM

## 2017-11-23 DIAGNOSIS — F5101 Primary insomnia: Secondary | ICD-10-CM

## 2017-11-23 DIAGNOSIS — F411 Generalized anxiety disorder: Secondary | ICD-10-CM

## 2017-11-23 DIAGNOSIS — R1110 Vomiting, unspecified: Secondary | ICD-10-CM

## 2017-11-23 DIAGNOSIS — I2510 Atherosclerotic heart disease of native coronary artery without angina pectoris: Secondary | ICD-10-CM

## 2017-11-23 DIAGNOSIS — J069 Acute upper respiratory infection, unspecified: Secondary | ICD-10-CM

## 2017-11-23 DIAGNOSIS — Z8619 Personal history of other infectious and parasitic diseases: Secondary | ICD-10-CM

## 2017-11-23 DIAGNOSIS — K2900 Acute gastritis without bleeding: Secondary | ICD-10-CM

## 2017-11-23 MED ORDER — ATORVASTATIN CALCIUM 40 MG PO TABS: 40.0000 mg | ORAL_TABLET | Freq: Every day | ORAL | 1 refills | Status: DC

## 2017-11-23 MED ORDER — BUPROPION HCL ER (XL) 300 MG PO TB24: 300.0000 mg | ORAL_TABLET | Freq: Every day | ORAL | 1 refills | Status: DC

## 2017-11-23 MED ORDER — LISINOPRIL 10 MG PO TABS: 10.0000 mg | ORAL_TABLET | Freq: Every day | ORAL | 5 refills | Status: DC

## 2017-11-23 MED ORDER — ZOLPIDEM TARTRATE 10 MG PO TABS: 10.0000 mg | ORAL_TABLET | Freq: Every day | ORAL | 5 refills | Status: DC

## 2017-11-23 MED ORDER — ALPRAZOLAM 0.5 MG PO TABS: 0.5000 mg | ORAL_TABLET | Freq: Two times a day (BID) | ORAL | 5 refills | Status: DC | PRN

## 2017-11-23 MED ORDER — PANTOPRAZOLE SODIUM 40 MG PO TBEC: 40.0000 mg | DELAYED_RELEASE_TABLET | Freq: Every day | ORAL | 3 refills | Status: DC

## 2017-11-23 MED ORDER — ONDANSETRON HCL 8 MG PO TABS: 8.0000 mg | ORAL_TABLET | Freq: Three times a day (TID) | ORAL | 1 refills | Status: DC | PRN

## 2017-11-23 NOTE — Patient Instructions (Addendum)
Tylenol cold sinus severe and flonase for URI  Labs for screening to evaluate abdomen pain.

## 2017-11-23 NOTE — Progress Notes (Signed)
Subjective:    Patient ID: Janice [name], female    DOB: 03-01-1971, 47 y.o.   MRN: [id number]  HPI  Pt is a 47 yo female with GAD, Depression, insomnia and hx of h.pylori infection.   Pt needs refills today.   Her anxiety and depression is not doing as well as she would like. She feels like it is due to getting the divorce papers and knowning that this is the end of a phase of her life. She admits "she feels a little lost". She denies any SI/HC. She finds a lot of joy in her granddaughter. She is working full time but struggling to know the "next step". She sleeps ok. Lorrin Mais does help.   She has also been congested and coughing for the last 3 days. She reports yellow sputum in the morning. No fever, chills, SOB, wheezing. She stried claritin with no results. She woke up with some eye discharge and crusty for last 2 days. flonase has not helped at all. Denies any sinus pressures, ear pain.   Pt is also having some stomach problems for the last month. She is concerned because she did have h.pyolri once before. She continues to have frequent loose stools after eating. She is very tender in her upper abdomen. She is having some dry heaving and nausea. No vomiting. Denies any hematochezia or melena.   .. Active Ambulatory Problems    Diagnosis Date Noted  . MIGRAINE HEADACHE 11/03/2010  . Insomnia 02/26/2014  . Hypertriglyceridemia, essential 02/27/2014  . Atherosclerosis of coronary artery 08/02/2014  . Fatty liver disease, nonalcoholic 22/11/5425  . Adenoma of right adrenal gland 08/02/2014  . H. pylori infection 08/05/2014  . Abnormal stress test 10/30/2014  . Adjustment disorder with mixed anxiety and depressed mood 03/12/2016  . Acute stress reaction 03/12/2016  . Emotional crisis as acute reaction to exceptional (gross) stress 03/12/2016  . Muscle spasm of back 03/30/2016  . Class 1 obesity due to excess calories without serious comorbidity with body mass index (BMI) of 33.0 to  33.9 in adult 05/12/2016  . Elevated blood pressure reading 05/12/2016  . GAD (generalized anxiety disorder) 11/18/2016  . Right lateral epicondylitis 12/31/2016  . Dry heaves 11/25/2017  . LUQ abdominal pain 11/25/2017   Resolved Ambulatory Problems    Diagnosis Date Noted  . No Resolved Ambulatory Problems   Past Medical History:  Diagnosis Date  . Hyperlipidemia     Review of Systems  All other systems reviewed and are negative.      Objective:   Physical Exam  Constitutional: She is oriented to person, place, and time. She appears well-developed and well-nourished.  HENT:  Head: Normocephalic and atraumatic.  Right Ear: External ear normal.  Left Ear: External ear normal.  Nose: Nose normal.  Mouth/Throat: Oropharynx is clear and moist. No oropharyngeal exudate.  Eyes: Conjunctivae are normal.  Neck: Normal range of motion. Neck supple. No thyromegaly present.  Cardiovascular: Normal rate, regular rhythm and normal heart sounds.  Pulmonary/Chest: Effort normal and breath sounds normal. She has no wheezes.  Negative for CVA tenderness.   Abdominal: Soft. Bowel sounds are normal. She exhibits distension. She exhibits no mass. There is tenderness. There is no rebound and no guarding.  No guarding or rebound. Most of discomfort is LUQ/epigastric and LLQ.   Lymphadenopathy:    She has no cervical adenopathy.  Neurological: She is alert and oriented to person, place, and time.  Skin: Skin is dry.  Psychiatric: She has  a normal mood and affect. Her behavior is normal.          Assessment & Plan:   Marland KitchenMarland KitchenDiagnoses and all orders for this visit:  Acute gastritis, presence of bleeding unspecified, unspecified gastritis type -     pantoprazole (PROTONIX) 40 MG tablet; Take 1 tablet (40 mg total) by mouth daily.  Elevated blood pressure reading -     lisinopril (PRINIVIL,ZESTRIL) 10 MG tablet; Take 1 tablet (10 mg total) by mouth daily.  GAD (generalized anxiety  disorder) -     COMPLETE METABOLIC PANEL WITH GFR -     ALPRAZolam (XANAX) 0.5 MG tablet; Take 1 tablet (0.5 mg total) by mouth 2 (two) times daily as needed for anxiety. -     buPROPion (WELLBUTRIN XL) 300 MG 24 hr tablet; Take 1 tablet (300 mg total) by mouth daily.  Atherosclerosis of native coronary artery of native heart without angina pectoris -     Lipid Panel w/reflex Direct LDL -     atorvastatin (LIPITOR) 40 MG tablet; Take 1 tablet (40 mg total) by mouth daily.  Primary insomnia -     zolpidem (AMBIEN) 10 MG tablet; Take 1 tablet (10 mg total) by mouth at bedtime.  Screening for diabetes mellitus -     COMPLETE METABOLIC PANEL WITH GFR  LUQ abdominal pain -     pantoprazole (PROTONIX) 40 MG tablet; Take 1 tablet (40 mg total) by mouth daily. -     Lipase -     Amylase -     CBC with Differential/Platelet -     H. pylori breath test  Dry heaves -     ondansetron (ZOFRAN) 8 MG tablet; Take 1 tablet (8 mg total) by mouth every 8 (eight) hours as needed for nausea or vomiting. -     Lipase -     Amylase -     CBC with Differential/Platelet  History of Helicobacter pylori infection -     H. pylori breath test     .. Depression screen [facility] 2/9 11/23/2017 06/07/2017 05/12/2016 03/30/2016 02/26/2014  Decreased Interest 1 1 1 1  0  Down, Depressed, Hopeless 1 1 1 1  0  PHQ - 2 Score 2 2 2 2  0  Altered sleeping 3 3 2 3  -  Tired, decreased energy 2 3 2 3  -  Change in appetite 1 3 1 3  -  Feeling bad or failure about yourself  0 1 1 1  -  Trouble concentrating 0 1 1 0 -  Moving slowly or fidgety/restless 0 0 0 0 -  Suicidal thoughts 0 0 0 1 -  PHQ-9 Score 8 13 9 13  -  Difficult doing work/chores - - - Somewhat difficult -   .. GAD 7 : Generalized Anxiety Score 06/07/2017 05/12/2016 03/30/2016  Nervous, Anxious, on Edge 1 2 1   Control/stop worrying 1 2 1   Worry too much - different things 1 2 1   Trouble relaxing 1 2 1   Restless 1 2 1   Easily annoyed or irritable 1 2 1   Afraid  - awful might happen 0 2 1  Total GAD 7 Score 6 14 7   Anxiety Difficulty Not difficult at all Somewhat difficult Somewhat difficult    Strongly encouraged a life coach or some counseling to help her find her next steps. Continue on wellbutrin for now.   Labs ordered to further evaluate LUQ pain.  Concern for h.pylori and even GI symptoms causing some of her URI symptoms. Breath test  done today. Restarted protonix today. zofran given for nausea. If no improvement or worsening symptoms please call office.   Discussed URI for 3 days. No signs of bacterial cause at this point. Symptomatic care discussed. Follow up if not improving.   Ordered lipid to follow up on hyperlipidemia.   Follow up in 2 weeks.   [name].Spent 30 minutes with patient and greater than 50 percent of visit spent counseling patient regarding treatment plan.

## 2017-11-24 LAB — H. PYLORI BREATH TEST: H. pylori Breath Test: NOT DETECTED

## 2017-11-24 NOTE — Progress Notes (Signed)
Call pt: H.pylori is negative. Have any of your symptoms improved with additional medications given yesterday?

## 2017-11-25 DIAGNOSIS — R1012 Left upper quadrant pain: Secondary | ICD-10-CM | POA: Insufficient documentation

## 2017-11-25 DIAGNOSIS — R1110 Vomiting, unspecified: Secondary | ICD-10-CM | POA: Insufficient documentation

## 2017-11-25 LAB — COMPLETE METABOLIC PANEL WITH GFR
AG Ratio: 1.9 (calc) (ref 1.0–2.5)
ALT: 13 U/L (ref 6–29)
AST: 13 U/L (ref 10–35)
Albumin: 4.5 g/dL (ref 3.6–5.1)
Alkaline phosphatase (APISO): 78 U/L (ref 33–115)
BUN: 11 mg/dL (ref 7–25)
CO2: 28 mmol/L (ref 20–32)
CREATININE: 0.78 mg/dL (ref 0.50–1.10)
Calcium: 10 mg/dL (ref 8.6–10.2)
Chloride: 105 mmol/L (ref 98–110)
GFR, EST AFRICAN AMERICAN: 105 mL/min/{1.73_m2} (ref >=60)
GFR, Est Non African American: 91 mL/min/{1.73_m2} (ref >=60)
GLOBULIN: 2.4 g/dL (ref 1.9–3.7)
GLUCOSE: 93 mg/dL (ref 65–99)
Potassium: 4.7 mmol/L (ref 3.5–5.3)
Sodium: 139 mmol/L (ref 135–146)
TOTAL PROTEIN: 6.9 g/dL (ref 6.1–8.1)
Total Bilirubin: 0.6 mg/dL (ref 0.2–1.2)

## 2017-11-25 LAB — LIPID PANEL W/REFLEX DIRECT LDL
CHOLESTEROL: 261 mg/dL — AB (ref <200)
HDL: 48 mg/dL — ABNORMAL LOW (ref >50)
LDL Cholesterol (Calc): 172 mg/dL (calc) — ABNORMAL HIGH
Non-HDL Cholesterol (Calc): 213 mg/dL (calc) — ABNORMAL HIGH (ref <130)
TRIGLYCERIDES: 241 mg/dL — AB (ref <150)
Total CHOL/HDL Ratio: 5.4 (calc) — ABNORMAL HIGH (ref <5.0)

## 2017-11-25 LAB — CBC WITH DIFFERENTIAL/PLATELET
BASOS ABS: 64 {cells}/uL (ref 0–200)
Basophils Relative: 0.7 %
EOS ABS: 73 {cells}/uL (ref 15–500)
Eosinophils Relative: 0.8 %
HEMATOCRIT: 44 % (ref 35.0–45.0)
HEMOGLOBIN: 15 g/dL (ref 11.7–15.5)
Lymphs Abs: 3458 cells/uL (ref 850–3900)
MCH: 30 pg (ref 27.0–33.0)
MCHC: 34.1 g/dL (ref 32.0–36.0)
MCV: 88 fL (ref 80.0–100.0)
MONOS PCT: 6.4 %
MPV: 9.3 fL (ref 7.5–12.5)
NEUTROS ABS: 4923 {cells}/uL (ref 1500–7800)
Neutrophils Relative %: 54.1 %
Platelets: 362 10*3/uL (ref 140–400)
RBC: 5 10*6/uL (ref 3.80–5.10)
RDW: 12.1 % (ref 11.0–15.0)
Total Lymphocyte: 38 %
WBC: 9.1 10*3/uL (ref 3.8–10.8)
WBCMIX: 582 {cells}/uL (ref 200–950)

## 2017-11-25 LAB — LIPASE: Lipase: 26 U/L (ref 7–60)

## 2017-11-25 LAB — AMYLASE: Amylase: 39 U/L (ref 21–101)

## 2017-11-29 NOTE — Progress Notes (Signed)
Call pt: is she taking lipitor daily? Cholesterol has worsened so I wanted to confirm before increase.

## 2017-11-30 NOTE — Progress Notes (Signed)
Call pt: we need to increase it to 80mg  daily.

## 2018-01-18 ENCOUNTER — Other Ambulatory Visit: Payer: Self-pay | Admitting: Physician Assistant

## 2018-05-11 ENCOUNTER — Encounter: Payer: Self-pay | Admitting: Physician Assistant

## 2018-05-11 ENCOUNTER — Ambulatory Visit (INDEPENDENT_AMBULATORY_CARE_PROVIDER_SITE_OTHER): Payer: BLUE CROSS/BLUE SHIELD | Admitting: Physician Assistant

## 2018-05-11 VITALS — BP 132/80 | HR 80 | Ht 60.0 in | Wt 187.0 lb

## 2018-05-11 DIAGNOSIS — F5101 Primary insomnia: Secondary | ICD-10-CM

## 2018-05-11 DIAGNOSIS — E782 Mixed hyperlipidemia: Secondary | ICD-10-CM | POA: Diagnosis not present

## 2018-05-11 DIAGNOSIS — E6609 Other obesity due to excess calories: Secondary | ICD-10-CM

## 2018-05-11 DIAGNOSIS — R030 Elevated blood-pressure reading, without diagnosis of hypertension: Secondary | ICD-10-CM | POA: Diagnosis not present

## 2018-05-11 DIAGNOSIS — F411 Generalized anxiety disorder: Secondary | ICD-10-CM

## 2018-05-11 DIAGNOSIS — I2510 Atherosclerotic heart disease of native coronary artery without angina pectoris: Secondary | ICD-10-CM

## 2018-05-11 DIAGNOSIS — Z6836 Body mass index (BMI) 36.0-36.9, adult: Secondary | ICD-10-CM

## 2018-05-11 MED ORDER — ZOLPIDEM TARTRATE 10 MG PO TABS: 10.0000 mg | ORAL_TABLET | Freq: Every day | ORAL | 5 refills | Status: DC

## 2018-05-11 MED ORDER — ATORVASTATIN CALCIUM 40 MG PO TABS: 40.0000 mg | ORAL_TABLET | Freq: Every day | ORAL | 3 refills | Status: DC

## 2018-05-11 MED ORDER — LISINOPRIL 10 MG PO TABS: 10.0000 mg | ORAL_TABLET | Freq: Every day | ORAL | 3 refills | Status: DC

## 2018-05-11 MED ORDER — BUPROPION HCL ER (XL) 300 MG PO TB24: 300.0000 mg | ORAL_TABLET | Freq: Every day | ORAL | 3 refills | Status: DC

## 2018-05-11 NOTE — Progress Notes (Signed)
Subjective:    Patient ID: Janice [name], female    DOB: [birth year], 47 y.o.   MRN: [id number]  HPI  Pt is a 47 yo obese female with HTN, HLD, CAD, insomnia, GAD who presents to the clinic for medication follow up.   She is doing really good. She started a new job 2 weeks ago working 1st shift. She is happy and going to concerts and hanging out with friends. She has gained 3lbs but she is going to a bariatric clinic that they are going to work on her weight. She is sleeping well. She is taking all of her medications. She denies any CP, palpitations, vision changes, SOB or dizziness.   .. Active Ambulatory Problems    Diagnosis Date Noted  . MIGRAINE HEADACHE 11/03/2010  . Insomnia 02/26/2014  . Hypertriglyceridemia, essential 02/27/2014  . Atherosclerosis of coronary artery 08/02/2014  . Fatty liver disease, nonalcoholic 02/58/5277  . Adenoma of right adrenal gland 08/02/2014  . H. pylori infection 08/05/2014  . Abnormal stress test 10/30/2014  . Adjustment disorder with mixed anxiety and depressed mood 03/12/2016  . Acute stress reaction 03/12/2016  . Emotional crisis as acute reaction to exceptional (gross) stress 03/12/2016  . Muscle spasm of back 03/30/2016  . Class 1 obesity due to excess calories without serious comorbidity with body mass index (BMI) of 33.0 to 33.9 in adult 05/12/2016  . Elevated blood pressure reading 05/12/2016  . GAD (generalized anxiety disorder) 11/18/2016  . Right lateral epicondylitis 12/31/2016  . Dry heaves 11/25/2017  . LUQ abdominal pain 11/25/2017  . Mixed hyperlipidemia 05/11/2018   Resolved Ambulatory Problems    Diagnosis Date Noted  . No Resolved Ambulatory Problems   Past Medical History:  Diagnosis Date  . Hyperlipidemia         Review of Systems  All other systems reviewed and are negative.      Objective:   Physical Exam  Constitutional: She is oriented to person, place, and time. She appears well-developed and  well-nourished.  HENT:  Head: Normocephalic and atraumatic.  Cardiovascular: Normal rate and regular rhythm.  Pulmonary/Chest: Effort normal and breath sounds normal.  Neurological: She is alert and oriented to person, place, and time.  Psychiatric: She has a normal mood and affect. Her behavior is normal.          Assessment & Plan:  Marland KitchenMarland KitchenDiagnoses and all orders for this visit:  Mixed hyperlipidemia -     Lipid Panel w/reflex Direct LDL  Primary insomnia -     zolpidem (AMBIEN) 10 MG tablet; Take 1 tablet (10 mg total) by mouth at bedtime.  Elevated blood pressure reading -     lisinopril (PRINIVIL,ZESTRIL) 10 MG tablet; Take 1 tablet (10 mg total) by mouth daily.  GAD (generalized anxiety disorder) -     buPROPion (WELLBUTRIN XL) 300 MG 24 hr tablet; Take 1 tablet (300 mg total) by mouth daily.  Atherosclerosis of native coronary artery of native heart without angina pectoris -     atorvastatin (LIPITOR) 40 MG tablet; Take 1 tablet (40 mg total) by mouth daily.  Class 2 obesity due to excess calories without serious comorbidity with body mass index (BMI) of 36.0 to 36.9 in adult   .[name] GAD 7 : Generalized Anxiety Score 05/11/2018 06/07/2017 05/12/2016 03/30/2016  Nervous, Anxious, on Edge 0 1 2 1   Control/stop worrying 0 1 2 1   Worry too much - different things 0 1 2 1   Trouble relaxing 0 1  2 1  Restless 0 1 2 1   Easily annoyed or irritable 0 1 2 1   Afraid - awful might happen 0 0 2 1  Total GAD 7 Score 0 6 14 7   Anxiety Difficulty Not difficult at all Not difficult at all Somewhat difficult Somewhat difficult    Doing great. Refilled medications.  Will check lipid to see how lipitor is doing with controlling LDL.   BP up some. 2nd recheck better. No changes made today. See weight loss discussion.   [name].Discussed low carb diet with 1500 calories and 80g of protein.  Exercising at least 150 minutes a week.  [employer] could be a Microbiologist.  Insurance will not pay  for prescription weight loss medication. She is going to a bariatric clinic.

## 2018-06-07 ENCOUNTER — Encounter: Payer: Self-pay | Admitting: Physician Assistant

## 2018-06-07 ENCOUNTER — Ambulatory Visit (INDEPENDENT_AMBULATORY_CARE_PROVIDER_SITE_OTHER): Payer: Self-pay | Admitting: Physician Assistant

## 2018-06-07 ENCOUNTER — Ambulatory Visit (INDEPENDENT_AMBULATORY_CARE_PROVIDER_SITE_OTHER): Payer: Self-pay

## 2018-06-07 VITALS — BP 121/76 | HR 60 | Temp 98.7°F | Resp 14 | Wt 186.0 lb

## 2018-06-07 DIAGNOSIS — R51 Headache: Secondary | ICD-10-CM

## 2018-06-07 DIAGNOSIS — H538 Other visual disturbances: Secondary | ICD-10-CM

## 2018-06-07 DIAGNOSIS — E348 Other specified endocrine disorders: Secondary | ICD-10-CM

## 2018-06-07 DIAGNOSIS — R519 Headache, unspecified: Secondary | ICD-10-CM

## 2018-06-07 DIAGNOSIS — E236 Other disorders of pituitary gland: Secondary | ICD-10-CM

## 2018-06-07 MED ORDER — METOCLOPRAMIDE HCL 5 MG/ML IJ SOLN
10.0000 mg | Freq: Once | INTRAVENOUS | Status: AC
  Administered 2018-06-07: 10 mg via INTRAMUSCULAR

## 2018-06-07 MED ORDER — METOCLOPRAMIDE HCL 10 MG PO TABS: ORAL_TABLET | ORAL | 0 refills | Status: DC

## 2018-06-07 MED ORDER — DEXAMETHASONE SODIUM PHOSPHATE 10 MG/ML IJ SOLN
10.0000 mg | Freq: Once | INTRAMUSCULAR | Status: AC
  Administered 2018-06-07: 10 mg via INTRAMUSCULAR

## 2018-06-07 NOTE — Progress Notes (Addendum)
HPI:                                                                Janice [name] is a 47 y.o. female who presents to [place] [place] today for headache  Headache   This is a recurrent problem. The current episode started in the past 7 days (x 3 days). The problem occurs constantly. The pain is located in the occipital region. The pain radiates to the right neck and left neck. The pain quality is not similar to prior headaches. The quality of the pain is described as squeezing. The pain is severe. Associated symptoms include blurred vision, neck pain, phonophobia, photophobia, scalp tenderness and vomiting. Pertinent negatives include no facial sweating or fever. Nothing aggravates the symptoms. She has tried darkened room, NSAIDs and acetaminophen for the symptoms. The treatment provided no relief. Her past medical history is significant for hypertension. There is no history of migraine headaches, migraines in the family or recent head traumas.      Past Medical History:  Diagnosis Date  . Hyperlipidemia    Past Surgical History:  Procedure Laterality Date  . ABDOMINAL HYSTERECTOMY    . APPENDECTOMY    . CHOLECYSTECTOMY     Social History   Tobacco Use  . Smoking status: Never Smoker  . Smokeless tobacco: Never Used  Substance Use Topics  . Alcohol use: No   family history includes Cancer in her maternal grandmother and mother; Heart attack in her mother; Hyperlipidemia in her father and mother; Hypertension in her father and mother; Stroke in her mother.    ROS: negative except as noted in the HPI  Medications: Current Outpatient Medications  Medication Sig Dispense Refill  . ALPRAZolam (XANAX) 0.5 MG tablet Take 1 tablet (0.5 mg total) by mouth 2 (two) times daily as needed for anxiety. 30 tablet 5  . atorvastatin (LIPITOR) 40 MG tablet Take 1 tablet (40 mg total) by mouth daily. 90 tablet 3  . buPROPion (WELLBUTRIN XL) 300  MG 24 hr tablet Take 1 tablet (300 mg total) by mouth daily. 90 tablet 3  . lisinopril (PRINIVIL,ZESTRIL) 10 MG tablet Take 1 tablet (10 mg total) by mouth daily. 90 tablet 3  . methocarbamol (ROBAXIN) 500 MG tablet TAKE 1 TABLET BY MOUTH 3 TIMES A DAY 90 tablet 1  . ondansetron (ZOFRAN) 8 MG tablet Take 1 tablet (8 mg total) by mouth every 8 (eight) hours as needed for nausea or vomiting. 20 tablet 1  . pantoprazole (PROTONIX) 40 MG tablet Take 1 tablet (40 mg total) by mouth daily. 30 tablet 3  . zolpidem (AMBIEN) 10 MG tablet Take 1 tablet (10 mg total) by mouth at bedtime. 30 tablet 5  . metoCLOPramide (REGLAN) 10 MG tablet 1 tab PO Q8H prn nausea/migraine 30 tablet 0   Current Facility-Administered Medications  Medication Dose Route Frequency Provider Last Rate Last Dose  . metoCLOPramide (REGLAN) 10 mg in dextrose 5 % 50 mL IVPB  10 mg Intramuscular Once Trixie Dredge, PA-C 208 mL/hr at 06/07/18 1014 10 mg at 06/07/18 1014   No Known Allergies     Objective:  BP 121/76   Pulse 60   Wt 186 lb (84.4 kg)  BMI 36.33 kg/m  Gen: well-groomed, appears uncomfortable, laying on exam table, wearing sunglasses, not toxic appearing, no acute distress HEENT: head normocephalic, atraumatic; conjunctiva and cornea clear, neck supple, no meningeal signs Pulm: Normal work of breathing, normal phonation Neuro:  cranial nerves II-XII intact, no nystagmus, normal finger-to-nose, normal heel-to-shin, negative pronator drift, normal rapid alternating movements, DTR's intact, normal tone, no tremor MSK: strength 5/5 and symmetric in bilateral upper and lower extremities, normal gait and station, negative Romberg Mental Status: alert and oriented x 3, speech articulate, and thought processes clear and goal-directed     No results found for this or any previous visit (from the past 72 hour(s)). Ct Head Wo Contrast  Result Date: 06/07/2018 CLINICAL DATA:  Headache and visual  sensitivity since Monday. EXAM: CT HEAD WITHOUT CONTRAST TECHNIQUE: Contiguous axial images were obtained from the base of the skull through the vertex without intravenous contrast. COMPARISON:  Report from 08/01/2013 FINDINGS: Brain: Partially empty sella, image 29/5. Rim calcification along a likely small pineal cyst measuring up to 9 mm in long axis. Otherwise, the brainstem, cerebellum, cerebral peduncles, thalami, basal ganglia, basilar cisterns, and ventricular system appear within normal limits. No intracranial hemorrhage, mass lesion, or acute CVA. Vascular: Unremarkable Skull: Unremarkable Sinuses/Orbits: Unremarkable Other: No supplemental non-categorized findings. IMPRESSION: 1. No specific abnormality to cause the patient's headache is identified. 2. Note is made of a partially empty sella. 3. There is also a rim calcified pineal cyst which is likely incidental/benign. Electronically Signed   By: Van Clines M.D.   On: 06/07/2018 10:01      Assessment and Plan: 47 y.o. female with   .Janice [name] was seen today for migraine.  Diagnoses and all orders for this visit:  Acute intractable headache, unspecified headache type -     CT Head Wo Contrast -     dexamethasone (DECADRON) injection 10 mg -     metoCLOPramide (REGLAN) 10 mg in dextrose 5 % 50 mL IVPB  Empty sella (HCC) Comments: incidental finding [facility] 06/07/18, partially empty  Cyst of pineal gland Comments: incidental finding [facility] 06/07/18  Other orders -     metoCLOPramide (REGLAN) 10 MG tablet; 1 tab PO Q8H prn nausea/migraine   - patient presents with 3 days of intractable occipital headache, new onset, described as different from prior headaches and worst headache of life. Reassuring neuro exam. Headache has migrainous features - stat CT head w/o contrast ordered and personally reviewed, no acute intracranial abnormalities - reviewed incidental findings of partially empty sella and pineal gland cyst - she was treated  in office with migraine cocktail of 10 mg Decadron IM and 10 mg Reglan IM. Observed for 10 minutes following injections, no adverse effects - discharged home with Reglan 10 mg PO Q8H prn for nausea/headache - return precautions discussed   Patient education and anticipatory guidance given Patient agrees with treatment plan Follow-up as needed if symptoms worsen or fail to improve  Darlyne Russian PA-C

## 2018-06-07 NOTE — Patient Instructions (Signed)
General Headache Without Cause A headache is pain or discomfort felt around the head or neck area. The specific cause of a headache may not be found. There are many causes and types of headaches. A few common ones are:  Tension headaches.  Migraine headaches.  Cluster headaches.  Chronic daily headaches.  Follow these instructions at home: Watch your condition for any changes. Take these steps to help with your condition: Managing pain  Take over-the-counter and prescription medicines only as told by your health care provider.  Lie down in a dark, quiet room when you have a headache.  If directed, apply ice to the head and neck area: ? Put ice in a plastic bag. ? Place a towel between your skin and the bag. ? Leave the ice on for 20 minutes, 2-3 times per day.  Use a heating pad or hot shower to apply heat to the head and neck area as told by your health care provider.  Keep lights dim if bright lights bother you or make your headaches worse. Eating and drinking  Eat meals on a regular schedule.  Limit alcohol use.  Decrease the amount of caffeine you drink, or stop drinking caffeine. General instructions  Keep all follow-up visits as told by your health care provider. This is important.  Keep a headache journal to help find out what may trigger your headaches. For example, write down: ? What you eat and drink. ? How much sleep you get. ? Any change to your diet or medicines.  Try massage or other relaxation techniques.  Limit stress.  Sit up straight, and do not tense your muscles.  Do not use tobacco products, including cigarettes, chewing tobacco, or e-cigarettes. If you need help quitting, ask your health care provider.  Exercise regularly as told by your health care provider.  Sleep on a regular schedule. Get 7-9 hours of sleep, or the amount recommended by your health care provider. Contact a health care provider if:  Your symptoms are not helped by  medicine.  You have a headache that is different from the usual headache.  You have nausea or you vomit.  You have a fever. Get help right away if:  Your headache becomes severe.  You have repeated vomiting.  You have a stiff neck.  You have a loss of vision.  You have problems with speech.  You have pain in the eye or ear.  You have muscular weakness or loss of muscle control.  You lose your balance or have trouble walking.  You feel faint or pass out.  You have confusion. This information is not intended to replace advice given to you by your health care provider. Make sure you discuss any questions you have with your health care provider. Document Released: 09/27/2005 Document Revised: 03/04/2016 Document Reviewed: 01/20/2015 Elsevier Interactive Patient Education  2018 Elsevier Inc.  

## 2018-06-09 ENCOUNTER — Other Ambulatory Visit: Payer: Self-pay | Admitting: Physician Assistant

## 2018-06-09 DIAGNOSIS — F411 Generalized anxiety disorder: Secondary | ICD-10-CM

## 2018-10-24 ENCOUNTER — Other Ambulatory Visit: Payer: Self-pay | Admitting: Physician Assistant

## 2018-10-24 DIAGNOSIS — F5101 Primary insomnia: Secondary | ICD-10-CM

## 2018-10-29 ENCOUNTER — Other Ambulatory Visit: Payer: Self-pay | Admitting: Physician Assistant

## 2018-10-29 DIAGNOSIS — F5101 Primary insomnia: Secondary | ICD-10-CM

## 2018-10-30 ENCOUNTER — Other Ambulatory Visit: Payer: Self-pay

## 2018-10-30 ENCOUNTER — Other Ambulatory Visit: Payer: Self-pay | Admitting: Physician Assistant

## 2018-10-30 DIAGNOSIS — F5101 Primary insomnia: Secondary | ICD-10-CM

## 2018-10-30 NOTE — Telephone Encounter (Signed)
Rx request received from pharmacy for one of Janice [name]'s patients. Patient is needing a refill on Ambien. Patient was last seen on 05/11/2018 and was advised to follow-up with Janice [name] around 11/11/2018. I have pended the medication for refill if appropriate. Thank you so much!

## 2018-11-22 ENCOUNTER — Other Ambulatory Visit: Payer: Self-pay | Admitting: Family Medicine

## 2018-11-22 ENCOUNTER — Other Ambulatory Visit: Payer: Self-pay | Admitting: Physician Assistant

## 2018-11-22 DIAGNOSIS — F411 Generalized anxiety disorder: Secondary | ICD-10-CM

## 2018-11-22 DIAGNOSIS — F5101 Primary insomnia: Secondary | ICD-10-CM

## 2018-11-22 NOTE — Telephone Encounter (Signed)
Last sent 06/13/18 for #30 with 5 RF  Last OV 05/11/18  No upcoming appts scheduled  RX pended with note pt needs appt  Please review and send if appropriate

## 2018-11-22 NOTE — Telephone Encounter (Signed)
Last filled 10/30/18  Last OV with PCP 05/11/18  No upcoming appts scheduled  RX pended with note that pt needs appt.   Please review and send if appropriate

## 2018-12-16 ENCOUNTER — Other Ambulatory Visit: Payer: Self-pay | Admitting: Physician Assistant

## 2018-12-16 DIAGNOSIS — F411 Generalized anxiety disorder: Secondary | ICD-10-CM

## 2018-12-16 DIAGNOSIS — F5101 Primary insomnia: Secondary | ICD-10-CM

## 2018-12-26 ENCOUNTER — Ambulatory Visit (INDEPENDENT_AMBULATORY_CARE_PROVIDER_SITE_OTHER): Payer: 59

## 2018-12-26 ENCOUNTER — Encounter: Payer: Self-pay | Admitting: Physician Assistant

## 2018-12-26 ENCOUNTER — Ambulatory Visit (INDEPENDENT_AMBULATORY_CARE_PROVIDER_SITE_OTHER): Payer: 59 | Admitting: Physician Assistant

## 2018-12-26 ENCOUNTER — Other Ambulatory Visit: Payer: Self-pay

## 2018-12-26 VITALS — BP 143/87 | HR 97 | Temp 98.6°F | Ht 60.0 in | Wt 196.4 lb

## 2018-12-26 DIAGNOSIS — E782 Mixed hyperlipidemia: Secondary | ICD-10-CM | POA: Diagnosis not present

## 2018-12-26 DIAGNOSIS — Z79899 Other long term (current) drug therapy: Secondary | ICD-10-CM

## 2018-12-26 DIAGNOSIS — S99912A Unspecified injury of left ankle, initial encounter: Secondary | ICD-10-CM

## 2018-12-26 DIAGNOSIS — I10 Essential (primary) hypertension: Secondary | ICD-10-CM

## 2018-12-26 DIAGNOSIS — W19XXXA Unspecified fall, initial encounter: Secondary | ICD-10-CM | POA: Diagnosis not present

## 2018-12-26 DIAGNOSIS — F411 Generalized anxiety disorder: Secondary | ICD-10-CM

## 2018-12-26 DIAGNOSIS — F5101 Primary insomnia: Secondary | ICD-10-CM | POA: Diagnosis not present

## 2018-12-26 DIAGNOSIS — E6609 Other obesity due to excess calories: Secondary | ICD-10-CM

## 2018-12-26 DIAGNOSIS — Z6838 Body mass index (BMI) 38.0-38.9, adult: Secondary | ICD-10-CM

## 2018-12-26 MED ORDER — ALPRAZOLAM 0.5 MG PO TABS: 0.5000 mg | ORAL_TABLET | Freq: Two times a day (BID) | ORAL | 4 refills | Status: DC | PRN

## 2018-12-26 MED ORDER — ZOLPIDEM TARTRATE 10 MG PO TABS: 10.0000 mg | ORAL_TABLET | Freq: Every day | ORAL | 5 refills | Status: DC

## 2018-12-26 MED ORDER — IBUPROFEN 800 MG PO TABS: 800.0000 mg | ORAL_TABLET | Freq: Three times a day (TID) | ORAL | 2 refills | Status: DC | PRN

## 2018-12-26 MED ORDER — LIRAGLUTIDE -WEIGHT MANAGEMENT 18 MG/3ML ~~LOC~~ SOPN: 0.6000 mg | PEN_INJECTOR | Freq: Every day | SUBCUTANEOUS | 1 refills | Status: DC

## 2018-12-26 NOTE — Patient Instructions (Signed)
saxenda  Liraglutide injection (Weight Management) What is this medicine? LIRAGLUTIDE (LIR a GLOO tide) is used with a reduced calorie diet and exercise to help you lose weight. This medicine may be used for other purposes; ask your health care provider or pharmacist if you have questions. COMMON BRAND NAME(S): Saxenda What should I tell my health care provider before I take this medicine? They need to know if you have any of these conditions: -endocrine tumors (MEN 2) or if someone in your family had these tumors -gallbladder disease -high cholesterol -history of alcohol abuse problem -history of pancreatitis -kidney disease or if you are on dialysis -liver disease -previous swelling of the tongue, face, or lips with difficulty breathing, difficulty swallowing, hoarseness, or tightening of the throat -stomach problems -suicidal thoughts, plans, or attempt; a previous suicide attempt by you or a family member -thyroid cancer or if someone in your family had thyroid cancer -an unusual or allergic reaction to liraglutide, other medicines, foods, dyes, or preservatives -pregnant or trying to get pregnant -breast-feeding How should I use this medicine? This medicine is for injection under the skin of your upper leg, stomach area, or upper arm. You will be taught how to prepare and give this medicine. Use exactly as directed. Take your medicine at regular intervals. Do not take it more often than directed. It is important that you put your used needles and syringes in a special sharps container. Do not put them in a trash can. If you do not have a sharps container, call your pharmacist or healthcare provider to get one. A special MedGuide will be given to you by the pharmacist with each prescription and refill. Be sure to read this information carefully each time. Talk to your pediatrician regarding the use of this medicine in children. Special care may be needed. Overdosage: If you think you  have taken too much of this medicine contact a poison control center or emergency room at once. NOTE: This medicine is only for you. Do not share this medicine with others. What if I miss a dose? If you miss a dose, take it as soon as you can. If it is almost time for your next dose, take only that dose. Do not take double or extra doses. If you miss your dose for 3 days or more, call your doctor or health care professional to talk about how to restart this medicine. What may interact with this medicine? -insulin and other medicines for diabetes This list may not describe all possible interactions. Give your health care provider a list of all the medicines, herbs, non-prescription drugs, or dietary supplements you use. Also tell them if you smoke, drink alcohol, or use illegal drugs. Some items may interact with your medicine. What should I watch for while using this medicine? Visit your doctor or health care professional for regular checks on your progress. This medicine is intended to be used in addition to a healthy diet and appropriate exercise. The best results are achieved this way. Do not increase or in any way change your dose without consulting your doctor or health care professional. Drink plenty of fluids while taking this medicine. Check with your doctor or health care professional if you get an attack of severe diarrhea, nausea, and vomiting. The loss of too much body fluid can make it dangerous for you to take this medicine. This medicine may affect blood sugar levels. If you have diabetes, check with your doctor or health care professional before you change  your diet or the dose of your diabetic medicine. Patients and their families should watch out for worsening depression or thoughts of suicide. Also watch out for sudden changes in feelings such as feeling anxious, agitated, panicky, irritable, hostile, aggressive, impulsive, severely restless, overly excited and hyperactive, or not being  able to sleep. If this happens, especially at the beginning of treatment or after a change in dose, call your health care professional. What side effects may I notice from receiving this medicine? Side effects that you should report to your doctor or health care professional as soon as possible: -allergic reactions like skin rash, itching or hives, swelling of the face, lips, or tongue -breathing problems -diarrhea that continues or is severe -lump or swelling on the neck -severe nausea -signs and symptoms of infection like fever or chills; cough; sore throat; pain or trouble passing urine -signs and symptoms of low blood sugar such as feeling anxious, confusion, dizziness, increased hunger, unusually weak or tired, sweating, shakiness, cold, irritable, headache, blurred vision, fast heartbeat, loss of consciousness -signs and symptoms of kidney injury like trouble passing urine or change in the amount of urine -trouble swallowing -unusual stomach upset or pain -vomiting Side effects that usually do not require medical attention (report to your doctor or health care professional if they continue or are bothersome): -constipation -decreased appetite -diarrhea -fatigue -headache -nausea -pain, redness, or irritation at site where injected -stomach upset -stuffy or runny nose This list may not describe all possible side effects. Call your doctor for medical advice about side effects. You may report side effects to FDA at 1-800-FDA-1088. Where should I keep my medicine? Keep out of the reach of children. Store unopened pen in a refrigerator between 2 and 8 degrees C (36 and 46 degrees F). Do not freeze or use if the medicine has been frozen. Protect from light and excessive heat. After you first use the pen, it can be stored at room temperature between 15 and 30 degrees C (59 and 86 degrees F) or in a refrigerator. Throw away your used pen after 30 days or after the expiration date, whichever  comes first. Do not store your pen with the needle attached. If the needle is left on, medicine may leak from the pen. NOTE: This sheet is a summary. It may not cover all possible information. If you have questions about this medicine, talk to your doctor, pharmacist, or health care provider.  2019 Elsevier/Gold Standard (2016-10-14 14:41:37)

## 2018-12-26 NOTE — Progress Notes (Signed)
Subjective:    Patient ID: Janice [name], female    DOB: [birth year], 48 y.o.   MRN: [id number]  HPI  Pt is a 48 yo obese female with HTN, GAD, insomnia, HLD who presents to the clinic for medication refills and follow up.   She is doing well on medication with no concerns. She just needs her [place] and xanax today. She is not using xanax daily.   BP elevated today in office but at 2:45 this afternoon she fell down the stairs at work and rolled her left ankle and hyper flexed it. She is in 10/10 pain and struggling to bear weight. Ankle has started to swell. She has been checking her BP at home and ranging in the 120's over 70's. No CP or SOB. She has not done anything for the ankle pain.   Pt would like to lose weight. She loses on phentermine but then gains it back. She would like to try another medication. She has on and off tried numerous diet and walking with no benefit. She is not currently exercising.   .. Active Ambulatory Problems    Diagnosis Date Noted  . MIGRAINE HEADACHE 11/03/2010  . Insomnia 02/26/2014  . Hypertriglyceridemia, essential 02/27/2014  . Atherosclerosis of coronary artery 08/02/2014  . Fatty liver disease, nonalcoholic 65/46/5035  . Adenoma of right adrenal gland 08/02/2014  . H. pylori infection 08/05/2014  . Abnormal stress test 10/30/2014  . Adjustment disorder with mixed anxiety and depressed mood 03/12/2016  . Emotional crisis as acute reaction to exceptional (gross) stress 03/12/2016  . Class 2 obesity due to excess calories without serious comorbidity with body mass index (BMI) of 38.0 to 38.9 in adult 05/12/2016  . Elevated blood pressure reading 05/12/2016  . GAD (generalized anxiety disorder) 11/18/2016  . Right lateral epicondylitis 12/31/2016  . Dry heaves 11/25/2017  . LUQ abdominal pain 11/25/2017  . Mixed hyperlipidemia 05/11/2018  . 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 Empty sella ([place]) 06/07/2018  . Essential hypertension 12/27/2018    Resolved Ambulatory Problems    Diagnosis Date Noted  . Acute stress reaction 03/12/2016  . Muscle spasm of back 03/30/2016   Past Medical History:  Diagnosis Date  . Hyperlipidemia       Review of Systems See HPI>     Objective:   Physical Exam Vitals signs reviewed.  HENT:     Head: Normocephalic and atraumatic.  Cardiovascular:     Rate and Rhythm: Normal rate and regular rhythm.     Pulses: Normal pulses.  Pulmonary:     Effort: Pulmonary effort is normal.     Breath sounds: Normal breath sounds.  Musculoskeletal:     Comments: Left ankle lateral malleolus tenderness and swelling. No redness.  NROM Pain with resistance.  Difficult to bear weight on ankle.   Neurological:     General: No focal deficit present.     Mental Status: She is alert and oriented to person, place, and time.  Psychiatric:        Mood and Affect: Mood normal.        Behavior: Behavior normal.           Assessment & Plan:  Marland KitchenMarland KitchenCrystallee was seen today for hypertension.  Diagnoses and all orders for this visit:  Injury of left ankle, initial encounter -     DG Ankle Complete Left -     ibuprofen (ADVIL,MOTRIN) 800 MG tablet; Take 1 tablet (800 mg total) by mouth every  8 (eight) hours as needed.  GAD (generalized anxiety disorder) -     ALPRAZolam (XANAX) 0.5 MG tablet; Take 1 tablet (0.5 mg total) by mouth 2 (two) times daily as needed. -     COMPLETE METABOLIC PANEL WITH GFR  Primary insomnia -     zolpidem (AMBIEN) 10 MG tablet; Take 1 tablet (10 mg total) by mouth at bedtime. -     COMPLETE METABOLIC PANEL WITH GFR  Mixed hyperlipidemia -     Lipid Panel w/reflex Direct LDL  Medication management -     COMPLETE METABOLIC PANEL WITH GFR  Class 2 obesity due to excess calories without serious comorbidity with body mass index (BMI) of 38.0 to 38.9 in adult -     Liraglutide -Weight Management (SAXENDA) 18 MG/3ML SOPN; Inject 0.6 mg into the skin daily. For one week then  increase by .6mg  weekly until reaches 3mg  daily.  Please include ultra fine needles 83mm  Essential hypertension   Left ankle Xray negative for fracture.  Discussed ankle sprain.  REST, ICE, Compress, Elevate.  Wrapped in ace wrap in office today.  Ibuprofen for pain.   Medications refilled. Discussed using xanax very sparingly. Advised of dependency risk.   BP elevated but in pain today. Continue to monitor at home.   [name].Discussed low carb diet with 1500 calories and 80g of protein.  Exercising at least 150 minutes a week.  [employer] could be a Microbiologist.  saxenda sent. Discussed side effects and titration. Coupon card given. Follow up in 2 months. No thyroid cancers or hx of pancreatitis.   [name].Spent 30 minutes with patient and greater than 50 percent of visit spent counseling patient regarding treatment plan.

## 2018-12-27 ENCOUNTER — Encounter: Payer: Self-pay | Admitting: Physician Assistant

## 2018-12-27 ENCOUNTER — Other Ambulatory Visit: Payer: Self-pay | Admitting: Physician Assistant

## 2018-12-27 DIAGNOSIS — I10 Essential (primary) hypertension: Secondary | ICD-10-CM

## 2018-12-27 HISTORY — DX: Essential (primary) hypertension: I10

## 2018-12-28 NOTE — Telephone Encounter (Signed)
Pt called requesting RF. Please route back for a follow up call to pt when completed

## 2019-01-11 ENCOUNTER — Other Ambulatory Visit: Payer: Self-pay

## 2019-01-11 ENCOUNTER — Ambulatory Visit (INDEPENDENT_AMBULATORY_CARE_PROVIDER_SITE_OTHER): Payer: 59

## 2019-01-11 ENCOUNTER — Encounter: Payer: Self-pay | Admitting: Family Medicine

## 2019-01-11 ENCOUNTER — Ambulatory Visit (INDEPENDENT_AMBULATORY_CARE_PROVIDER_SITE_OTHER): Payer: 59 | Admitting: Family Medicine

## 2019-01-11 VITALS — BP 126/63 | HR 73 | Wt 201.0 lb

## 2019-01-11 DIAGNOSIS — M79641 Pain in right hand: Secondary | ICD-10-CM

## 2019-01-11 DIAGNOSIS — M65311 Trigger thumb, right thumb: Secondary | ICD-10-CM | POA: Diagnosis not present

## 2019-01-11 DIAGNOSIS — M654 Radial styloid tenosynovitis [de Quervain]: Secondary | ICD-10-CM

## 2019-01-11 NOTE — Patient Instructions (Signed)
Thank you for coming in today. Use the splint for about 1 week then transition to double bandaid splint.  Recheck as need.  Call or go to the ER if you develop a large red swollen joint with extreme pain or oozing puss.  Use the diclofenac gel up to 4x daily for pain.    Trigger Finger  Trigger finger (stenosing tenosynovitis) is a condition that causes a finger to get stuck in a bent position. Each finger has a tough, cord-like tissue that connects muscle to bone (tendon), and each tendon is surrounded by a tunnel of tissue (tendon sheath). To move your finger, your tendon needs to slide freely through the sheath. Trigger finger happens when the tendon or the sheath thickens, making it difficult to move your finger. Trigger finger can affect any finger or a thumb. It may affect more than one finger. Mild cases may clear up with rest and medicine. Severe cases require more treatment. What are the causes? Trigger finger is caused by a thickened finger tendon or tendon sheath. The cause of this thickening is not known. What increases the risk? The following factors may make you more likely to develop this condition:  Doing activities that require a strong grip.  Having rheumatoid arthritis, gout, or diabetes.  Being 67-59 years old.  Being a woman. What are the signs or symptoms? Symptoms of this condition include:  Pain when bending or straightening your finger.  Tenderness or swelling where your finger attaches to the palm of your hand.  A lump in the palm of your hand or on the inside of your finger.  Hearing a popping sound when you try to straighten your finger.  Feeling a popping, catching, or locking sensation when you try to straighten your finger.  Being unable to straighten your finger. How is this diagnosed? This condition is diagnosed based on your symptoms and a physical exam. How is this treated? This condition may be treated by:  Resting your finger and avoiding  activities that make symptoms worse.  Wearing a finger splint to keep your finger in a slightly bent position.  Taking NSAIDs to relieve pain and swelling.  Injecting medicine (steroids) into the tendon sheath to reduce swelling and irritation. Injections may need to be repeated.  Having surgery to open the tendon sheath. This may be done if other treatments do not work and you cannot straighten your finger. You may need physical therapy after surgery. Follow these instructions at home:   Use moist heat to help reduce pain and swelling as told by your health care provider.  Rest your finger and avoid activities that make pain worse. Return to normal activities as told by your health care provider.  If you have a splint, wear it as told by your health care provider.  Take over-the-counter and prescription medicines only as told by your health care provider.  Keep all follow-up visits as told by your health care provider. This is important. Contact a health care provider if:  Your symptoms are not improving with home care. Summary  Trigger finger (stenosing tenosynovitis) causes your finger to get stuck in a bent position, and it can make it difficult and painful to straighten your finger.  This condition develops when a finger tendon or tendon sheath thickens.  Treatment starts with resting, wearing a splint, and taking NSAIDs.  In severe cases, surgery to open the tendon sheath may be needed. This information is not intended to replace advice given to you  by your health care provider. Make sure you discuss any questions you have with your health care provider. Document Released: 07/17/2004 Document Revised: 09/07/2016 Document Reviewed: 09/07/2016 Elsevier Interactive Patient Education  2019 [employer] Tenosynovitis  De Quervain's tenosynovitis is a condition that causes inflammation of the tendon on the thumb side of the wrist. Tendons are cords of tissue  that connect bones to muscles. The tendons in the hand pass through a tunnel called a sheath. A slippery layer of tissue (synovium) lets the tendons move smoothly in the sheath. With de Quervain's tenosynovitis, the sheath swells or thickens, causing friction and pain. The condition is also called de Quervain's disease and de Quervain's syndrome. It occurs most often in women who are 40-75 years old. What are the causes? The exact cause of this condition is not known. It may be associated with overuse of the hand and wrist. What increases the risk? You are more likely to develop this condition if you:  Use your hands far more than normal, especially if you repeat certain movements that involve twisting your hand or using a tight grip.  Are pregnant.  Are a middle-aged woman.  Have rheumatoid arthritis.  Have diabetes. What are the signs or symptoms? The main symptom of this condition is pain on the thumb side of the wrist. The pain may get worse when you grasp something or turn your wrist. Other symptoms may include:  Pain that extends up the forearm.  Swelling of your wrist and hand.  Trouble moving the thumb and wrist.  A sensation of snapping in the wrist.  A bump filled with fluid (cyst) in the area of the pain. How is this diagnosed? This condition may be diagnosed based on:  Your symptoms and medical history.  A physical exam. During the exam, your health care provider may do a simple test Wynn Maudlin test) that involves pulling your thumb and wrist to see if this causes pain. You may also need to have an X-ray. How is this treated? Treatment for this condition may include:  Avoiding any activity that causes pain and swelling.  Taking medicines. Anti-inflammatory medicines and corticosteroid injections may be used to reduce inflammation and relieve pain.  Wearing a splint.  Having surgery. This may be needed if other treatments do not work. Once the pain and  swelling has gone down:  Physical therapy. This includes stretching and strengthening exercises.  Occupational therapy. This includes adjusting how you move your wrist. Follow these instructions at home: If you have a splint:  Wear the splint as told by your health care provider. Remove it only as told by your health care provider.  Loosen the splint if your fingers tingle, become numb, or turn cold and blue.  Keep the splint clean.  If the splint is not waterproof: ? Do not let it get wet. ? Cover it with a watertight covering when you take a bath or a shower. Managing pain, stiffness, and swelling   Avoid movements and activities that cause pain and swelling in the wrist area.  If directed, put ice on the painful area. This may be helpful after doing activities that involve the sore wrist. ? Put ice in a plastic bag. ? Place a towel between your skin and the bag. ? Leave the ice on for 20 minutes, 2-3 times a day.  Move your fingers often to avoid stiffness and to lessen swelling.  Raise (elevate) the injured area above the level of  your heart while you are sitting or lying down. General instructions  Return to your normal activities as told by your health care provider. Ask your health care provider what activities are safe for you.  Take over-the-counter and prescription medicines only as told by your health care provider.  Keep all follow-up visits as told by your health care provider. This is important. Contact a health care provider if:  Your pain medicine does not help.  Your pain gets worse.  You develop new symptoms. Summary  De Quervain's tenosynovitis is a condition that causes inflammation of the tendon on the thumb side of the wrist.  The condition occurs most often in women who are 61-61 years old.  The exact cause of this condition is not known. It may be associated with overuse of the hand and wrist.  Treatment starts with avoiding activity that  causes pain or swelling in the wrist area. Other treatment may include wearing a splint and taking medicine. Sometimes, surgery is needed. This information is not intended to replace advice given to you by your health care provider. Make sure you discuss any questions you have with your health care provider. Document Released: 06/22/2001 Document Revised: 03/30/2018 Document Reviewed: 09/05/2017 Elsevier Interactive Patient Education  2019 [employer].

## 2019-01-11 NOTE — Progress Notes (Signed)
Janice [name] is a 48 y.o. female who presents to [place] today for right hand pain.  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  She notes the pain is typically located in the thumb at the [facility] MCP and interphalangeal joint.  She notes that she has some swelling at her IP joint.  She notes a sensation of popping and catching especially with thumb flexion at IP causing pain to the interphalangeal joint and MCP joint.  She notes her pain is quite bothersome and does interfere with normal activities.  She is tried rest NSAIDs and compression glove which have not helped much.  She denies any fevers chills nausea vomiting or diarrhea.  She denies any injury history.    ROS:  As above  Exam:  BP 126/63   Pulse 73   Wt 201 lb (91.2 kg)   BMI 39.26 kg/m  Wt Readings from Last 5 Encounters:  01/11/19 201 lb (91.2 kg)  12/26/18 196 lb 6.4 oz (89.1 kg)  06/07/18 186 lb (84.4 kg)  05/11/18 187 lb (84.8 kg)  11/23/17 184 lb (83.5 kg)   General: Well Developed, well nourished, and in no acute distress.  Neuro/Psych: Alert and oriented x3, extra-ocular muscles intact, able to move all 4 extremities, sensation grossly intact. Skin: Warm and dry, no rashes noted.  Respiratory: Not using accessory muscles, speaking in full sentences, trachea midline.  Cardiovascular: Pulses palpable, no extremity edema. Abdomen: Does not appear distended. MSK:  Right hand: Slight swelling at right thumb IP joint.  Otherwise hand is normal-appearing Tender to palpation right first digit IP joint and MCP.   Triggering present with flexion at IP palpated in the IP joint itself as well as palmar MCP  Right wrist: Tender palpation radial styloid.  Wrist motion normal.  Positive Finkelstein's test. Pulses cap refill and sensation are intact distally.    Lab and Radiology Results No results found for  this or any previous visit (from the past 72 hour(s)). Dg Hand Complete Right  Result Date: 01/11/2019 CLINICAL DATA:  48 year-old female c/o RIGHT hand pain x 1 month. Pt denies injury. Pt c/o pain to her thumb at the IP and MCP w/ swelling. EXAM: RIGHT HAND - COMPLETE 3+ VIEW COMPARISON:  None. FINDINGS: No fracture or bone lesion. Marginal spurring noted from the radial margin of the thumb IP joint and minimally from the radial margin of the thumb MCP joint. No joint space narrowing. Minor marginal osteophytes are noted from the DIP joints of the index and middle fingers without significant joint space narrowing. No periarticular erosions. Remaining joints normally spaced and aligned. Mild soft tissue swelling at the IP joint of the thumb. IMPRESSION: 1. No fracture or bone lesion. 2. Mild arthropathic changes most evident at the IP joint of the thumb, reflected by marginal osteophytes. No periarticular erosions or significant joint space narrowing. 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 Electronically Signed   By: Lajean Manes M.D.   On: 01/11/2019 13:33   I personally (independently) visualized and performed the interpretation of the images attached in this note.  Procedure: Real-time Ultrasound Guided Injection of right thumb A1 pulley trigger finger injection Device: GE Logiq E   Images permanently stored and available for review in the ultrasound unit. Verbal informed consent obtained.  Discussed risks and benefits of procedure. Warned about infection bleeding damage to structures skin hypopigmentation and  fat atrophy among others. Patient expresses understanding and agreement Time-out conducted.   Noted no overlying erythema, induration, or other signs of local infection.   Skin prepped in a sterile fashion.   Local anesthesia: Topical Ethyl chloride.   With sterile technique and under real time ultrasound guidance:  1 mL of lidocaine and 40 mg of Depo-Medrol (0.5 mL of 80 mg/mL  solution)  injected easily.   Completed without difficulty   Pain immediately resolved suggesting accurate placement of the medication.   Advised to call if fevers/chills, erythema, induration, drainage, or persistent bleeding.   Images permanently stored and available for review in the ultrasound unit.  Impression: Technically successful ultrasound guided injection.   Patient was fitted with a well-made fiberglass thumb spica splint.     Assessment and Plan: 48 y.o. 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  Plan for relative rest splinting with thumb spica splint and then to double Band-Aid splint.  Additionally will use diclofenac gel and home exercise program.  Lastly we will proceed with trigger finger injection as noted above.  Recheck via MyChart in the near future.  Recheck as needed sooner if needed.  Work note provided.   PDMP not reviewed this encounter. Orders Placed This Encounter  Procedures  . DG Hand Complete Right    Standing Status:   Future    Number of Occurrences:   1    Standing Expiration Date:   03/12/2020    Order Specific Question:   Reason for Exam (SYMPTOM  OR DIAGNOSIS REQUIRED)    Answer:   eval hand pain especially thumb. Swelling and pain at IP and MCP    Order Specific Question:   Is patient pregnant?    Answer:   No    Order Specific Question:   Preferred imaging location?    Answer:   [facility]    Order Specific Question:   Radiology Contrast Protocol - do NOT remove file path    Answer:   [email or web address]   No orders of the defined types were placed in this encounter.   Historical information moved to improve visibility of documentation.  Past Medical History:  Diagnosis Date  . Essential hypertension 12/27/2018  . Hyperlipidemia    Past Surgical History:  Procedure Laterality Date  . ABDOMINAL HYSTERECTOMY     . APPENDECTOMY    . CHOLECYSTECTOMY     Social History   Tobacco Use  . Smoking status: Never Smoker  . Smokeless tobacco: Never Used  Substance Use Topics  . Alcohol use: No   family history includes Cancer in her maternal grandmother and mother; Heart attack in her mother; Hyperlipidemia in her father and mother; Hypertension in her father and mother; Stroke in her mother.  Medications: Current Outpatient Medications  Medication Sig Dispense Refill  . ALPRAZolam (XANAX) 0.5 MG tablet Take 1 tablet (0.5 mg total) by mouth 2 (two) times daily as needed. 30 tablet 4  . atorvastatin (LIPITOR) 40 MG tablet Take 1 tablet (40 mg total) by mouth daily. 90 tablet 3  . buPROPion (WELLBUTRIN XL) 300 MG 24 hr tablet Take 1 tablet (300 mg total) by mouth daily. 90 tablet 3  . ibuprofen (ADVIL,MOTRIN) 800 MG tablet Take 1 tablet (800 mg total) by mouth every 8 (eight) hours as needed. 60 tablet 2  . Liraglutide -Weight Management (SAXENDA) 18 MG/3ML SOPN Inject 0.6 mg into the skin  daily. For one week then increase by .6mg  weekly until reaches 3mg  daily.  Please include ultra fine needles 5mm 5 pen 1  . lisinopril (PRINIVIL,ZESTRIL) 10 MG tablet Take 1 tablet (10 mg total) by mouth daily. 90 tablet 3  . methocarbamol (ROBAXIN) 500 MG tablet TAKE ONE TABLET BY MOUTH THREE TIMES A DAY 90 tablet 1  . metoCLOPramide (REGLAN) 10 MG tablet 1 tab PO Q8H prn nausea/migraine 30 tablet 0  . ondansetron (ZOFRAN) 8 MG tablet Take 1 tablet (8 mg total) by mouth every 8 (eight) hours as needed for nausea or vomiting. 20 tablet 1  . pantoprazole (PROTONIX) 40 MG tablet Take 1 tablet (40 mg total) by mouth daily. 30 tablet 3  . zolpidem (AMBIEN) 10 MG tablet Take 1 tablet (10 mg total) by mouth at bedtime. 30 tablet 5   No current facility-administered medications for this visit.    No Known Allergies    Discussed warning signs or symptoms. Please see discharge instructions. Patient expresses understanding.

## 2019-03-21 ENCOUNTER — Other Ambulatory Visit: Payer: Self-pay | Admitting: Physician Assistant

## 2019-03-21 DIAGNOSIS — F411 Generalized anxiety disorder: Secondary | ICD-10-CM

## 2019-03-21 NOTE — Telephone Encounter (Signed)
Please advise on refill - Janice [name]'s pt. Due for follow up - anxiety. Please advise if okay to send one month refill.

## 2019-03-22 ENCOUNTER — Other Ambulatory Visit: Payer: Self-pay | Admitting: Physician Assistant

## 2019-04-04 ENCOUNTER — Other Ambulatory Visit: Payer: Self-pay | Admitting: Family Medicine

## 2019-04-04 DIAGNOSIS — F411 Generalized anxiety disorder: Secondary | ICD-10-CM

## 2019-04-05 NOTE — Telephone Encounter (Signed)
Please schedule appt with Jade. Thanks.

## 2019-04-05 NOTE — Telephone Encounter (Signed)
Needs appt. Can do virtual.

## 2019-04-05 NOTE — Telephone Encounter (Signed)
Left message with mom since patient was working, patient will call back to schedule an appointment.

## 2019-04-05 NOTE — Telephone Encounter (Signed)
Will forward request to provider for approval.

## 2019-04-06 ENCOUNTER — Encounter: Payer: Self-pay | Admitting: Physician Assistant

## 2019-04-06 ENCOUNTER — Ambulatory Visit (INDEPENDENT_AMBULATORY_CARE_PROVIDER_SITE_OTHER): Payer: 59 | Admitting: Physician Assistant

## 2019-04-06 VITALS — BP 120/82 | Temp 98.1°F | Ht 60.0 in | Wt 189.0 lb

## 2019-04-06 DIAGNOSIS — I10 Essential (primary) hypertension: Secondary | ICD-10-CM

## 2019-04-06 DIAGNOSIS — R1012 Left upper quadrant pain: Secondary | ICD-10-CM | POA: Diagnosis not present

## 2019-04-06 DIAGNOSIS — F411 Generalized anxiety disorder: Secondary | ICD-10-CM

## 2019-04-06 DIAGNOSIS — K2900 Acute gastritis without bleeding: Secondary | ICD-10-CM

## 2019-04-06 DIAGNOSIS — I2510 Atherosclerotic heart disease of native coronary artery without angina pectoris: Secondary | ICD-10-CM

## 2019-04-06 MED ORDER — PANTOPRAZOLE SODIUM 40 MG PO TBEC: 40.0000 mg | DELAYED_RELEASE_TABLET | Freq: Every day | ORAL | 3 refills | Status: DC

## 2019-04-06 MED ORDER — ATORVASTATIN CALCIUM 40 MG PO TABS: 40.0000 mg | ORAL_TABLET | Freq: Every day | ORAL | 3 refills | Status: DC

## 2019-04-06 MED ORDER — LISINOPRIL 10 MG PO TABS: 10.0000 mg | ORAL_TABLET | Freq: Every day | ORAL | 3 refills | Status: DC

## 2019-04-06 MED ORDER — BUPROPION HCL ER (XL) 300 MG PO TB24: 300.0000 mg | ORAL_TABLET | Freq: Every day | ORAL | 3 refills | Status: DC

## 2019-04-06 MED ORDER — ALPRAZOLAM 0.5 MG PO TABS: 0.5000 mg | ORAL_TABLET | Freq: Two times a day (BID) | ORAL | 3 refills | Status: DC | PRN

## 2019-04-06 NOTE — Progress Notes (Signed)
Patient doing okay. PHQ9-GAD7 completed.  Asks for visit to be around 11:35-11:40 am so she can make it to lunch (currently working).

## 2019-04-06 NOTE — Progress Notes (Signed)
Patient ID: Janice [name], female   DOB: [birth year], 48 y.o.   MRN: [id number] .Marland KitchenVirtual Visit via Telephone Note  I connected with Janice [name] on 04/06/19 at 11:30 AM EDT by telephone and verified that I am speaking with the correct person using two identifiers.  Location: Patient:work Provider: clinic   I discussed the limitations, risks, security and privacy concerns of performing an evaluation and management service by telephone and the availability of in person appointments. I also discussed with the patient that there may be a patient responsible charge related to this service. The patient expressed understanding and agreed to proceed.   History of Present Illness: Pt is a 48 yo female with GAD, HLD, Gastritis who calls in for refills.   She admits her anxiety has been worse over the last few months. She has worked through [employer]. She has been using xanax more. Her mother has also been sick. She feels on edge a lot. No SI/HC.   Denies any CP, palpitations, SOB, headaches.   .. Active Ambulatory Problems    Diagnosis Date Noted  . MIGRAINE HEADACHE 11/03/2010  . Insomnia 02/26/2014  . Hypertriglyceridemia, essential 02/27/2014  . Atherosclerosis of coronary artery 08/02/2014  . Fatty liver disease, nonalcoholic 78/29/5621  . Adenoma of right adrenal gland 08/02/2014  . H. pylori infection 08/05/2014  . Abnormal stress test 10/30/2014  . Adjustment disorder with mixed anxiety and depressed mood 03/12/2016  . Emotional crisis as acute reaction to exceptional (gross) stress 03/12/2016  . Class 2 obesity due to excess calories without serious comorbidity with body mass index (BMI) of 38.0 to 38.9 in adult 05/12/2016  . Elevated blood pressure reading 05/12/2016  . GAD (generalized anxiety disorder) 11/18/2016  . Right lateral epicondylitis 12/31/2016  . Dry heaves 11/25/2017  . LUQ abdominal pain 11/25/2017  . Mixed hyperlipidemia 05/11/2018  . 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 Empty sella ([place]) 06/07/2018  . Essential hypertension 12/27/2018   Resolved Ambulatory Problems    Diagnosis Date Noted  . Acute stress reaction 03/12/2016  . Muscle spasm of back 03/30/2016   Past Medical History:  Diagnosis Date  . Hyperlipidemia    Reviewed med, allergy, problem list.     Observations/Objective: No acute distress. Normal mood.  Normal breathing.   .. Today's Vitals   04/06/19 1029  BP: 120/82  Temp: 98.1 F (36.7 C)  TempSrc: Oral  Weight: 189 lb (85.7 kg)  Height: 5' (1.524 m)   Body mass index is 36.91 kg/m.   .. Depression screen [facility] 2/9 04/06/2019 11/23/2017 06/07/2017 05/12/2016 03/30/2016  Decreased Interest 1 1 1 1 1   Down, Depressed, Hopeless 0 1 1 1 1   PHQ - 2 Score 1 2 2 2 2   Altered sleeping 3 3 3 2 3   Tired, decreased energy 3 2 3 2 3   Change in appetite 3 1 3 1 3   Feeling bad or failure about yourself  1 0 1 1 1   Trouble concentrating 0 0 1 1 0  Moving slowly or fidgety/restless 0 0 0 0 0  Suicidal thoughts 0 0 0 0 1  PHQ-9 Score 11 8 13 9 13   Difficult doing work/chores Somewhat difficult - - - Somewhat difficult   .[name] GAD 7 : Generalized Anxiety Score 04/06/2019 05/11/2018 06/07/2017 05/12/2016  Nervous, Anxious, on Edge 3 0 1 2  Control/stop worrying 0 0 1 2  Worry too much - different things 3 0 1 2  Trouble relaxing 3  0 1 2  Restless 1 0 1 2  Easily annoyed or irritable 3 0 1 2  Afraid - awful might happen 1 0 0 2  Total GAD 7 Score 14 0 6 14  Anxiety Difficulty Not difficult at all Not difficult at all Not difficult at all Somewhat difficult     Assessment and Plan: Marland KitchenMarland KitchenTrenita was seen today for anxiety and hypertension.  Diagnoses and all orders for this visit:  Essential hypertension  GAD (generalized anxiety disorder) -     ALPRAZolam (XANAX) 0.5 MG tablet; Take 1 tablet (0.5 mg total) by mouth 2 (two) times daily as needed for anxiety. -     buPROPion (WELLBUTRIN XL) 300 MG 24 hr tablet; Take 1 tablet  (300 mg total) by mouth daily.  Atherosclerosis of native coronary artery of native heart without angina pectoris -     atorvastatin (LIPITOR) 40 MG tablet; Take 1 tablet (40 mg total) by mouth daily.  Elevated blood pressure reading -     lisinopril (ZESTRIL) 10 MG tablet; Take 1 tablet (10 mg total) by mouth daily.  LUQ abdominal pain -     pantoprazole (PROTONIX) 40 MG tablet; Take 1 tablet (40 mg total) by mouth daily.  Acute gastritis, presence of bleeding unspecified, unspecified gastritis type -     pantoprazole (PROTONIX) 40 MG tablet; Take 1 tablet (40 mg total) by mouth daily.   BP looks great.  Medications refilled.   Anxiety and depression numbers up a bit. Ok for refill of xanax for a few more months during COVID etc. Would like to see her start using less. Discussed exercise, mediatation, essential oils etc. Discussed dependency risk.  Marland KitchenMarland KitchenPDMP reviewed during this encounter.  Follow up in 3 months.  Follow Up Instructions:    I discussed the assessment and treatment plan with the patient. The patient was provided an opportunity to ask questions and all were answered. The patient agreed with the plan and demonstrated an understanding of the instructions.   The patient was advised to call back or seek an in-person evaluation if the symptoms worsen or if the condition fails to improve as anticipated.  I provided 15 minutes of non-face-to-face time during this encounter.   Iran Planas, PA-C

## 2019-06-01 ENCOUNTER — Other Ambulatory Visit: Payer: Self-pay | Admitting: Physician Assistant

## 2019-06-01 DIAGNOSIS — F5101 Primary insomnia: Secondary | ICD-10-CM

## 2019-08-02 ENCOUNTER — Other Ambulatory Visit: Payer: Self-pay | Admitting: Physician Assistant

## 2019-08-02 DIAGNOSIS — F411 Generalized anxiety disorder: Secondary | ICD-10-CM

## 2019-08-02 NOTE — Telephone Encounter (Signed)
Last filled 04/21/2019 #60 with 3 refills. Too early for refill.

## 2019-08-06 ENCOUNTER — Other Ambulatory Visit: Payer: Self-pay

## 2019-08-06 ENCOUNTER — Other Ambulatory Visit: Payer: Self-pay | Admitting: Physician Assistant

## 2019-08-06 ENCOUNTER — Ambulatory Visit (INDEPENDENT_AMBULATORY_CARE_PROVIDER_SITE_OTHER): Payer: 59 | Admitting: Family Medicine

## 2019-08-06 ENCOUNTER — Encounter: Payer: Self-pay | Admitting: Family Medicine

## 2019-08-06 VITALS — BP 159/94 | HR 84 | Temp 98.1°F | Wt 201.0 lb

## 2019-08-06 DIAGNOSIS — F411 Generalized anxiety disorder: Secondary | ICD-10-CM

## 2019-08-06 DIAGNOSIS — M65311 Trigger thumb, right thumb: Secondary | ICD-10-CM

## 2019-08-06 NOTE — Patient Instructions (Signed)
Thank you for coming in today. Use the double bandaid splint.  Recheck as needed.  If not improving we can do more evaluation.

## 2019-08-06 NOTE — Progress Notes (Signed)
Janice [name] is a 48 y.o. female who presents to [facility] today for right hand and thumb pain.  Right thumb pain.  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  Treated with trigger thumb injection at A1 pulley in April.  She notes that worked very well to control her pain.  She did not have any pain until about 2 weeks ago when the pain started returning.  She notes recurrent triggering and pain at the thumb and on the dorsal aspect of the hand.  No recurrent injury.  No fevers chills nausea vomiting or diarrhea.  She would like to proceed with repeat injection if possible.  She is tried some over-the-counter medications which have not helped much.  ROS:  As above  Exam:  BP (!) 159/94   Pulse 84   Temp 98.1 F (36.7 C) (Oral)   Wt 201 lb (91.2 kg)   BMI 39.26 kg/m  Wt Readings from Last 5 Encounters:  08/06/19 201 lb (91.2 kg)  04/06/19 189 lb (85.7 kg)  01/11/19 201 lb (91.2 kg)  12/26/18 196 lb 6.4 oz (89.1 kg)  06/07/18 186 lb (84.4 kg)   General: Well Developed, well nourished, and in no acute distress.  Neuro/Psych: Alert and oriented x3, extra-ocular muscles intact, able to move all 4 extremities, sensation grossly intact. Skin: Warm and dry, no rashes noted.  Respiratory: Not using accessory muscles, speaking in full sentences, trachea midline.  Cardiovascular: Pulses palpable, no extremity edema. Abdomen: Does not appear distended. MSK:  Right hand largely normal-appearing with no significant swelling.  Tender palpation palmar thumb MCP with triggering at IP joint. Strength capillary refill pulses and sensation are intact.    Lab and Radiology Results  Procedure: Real-time Ultrasound Guided Injection of right thumb A1 pulley Device: GE Logiq E   Images permanently stored and available for review in the ultrasound unit. Verbal  informed consent obtained.  Discussed risks and benefits of procedure. Warned about infection bleeding damage to structures skin hypopigmentation and fat atrophy among others. Patient expresses understanding and agreement Time-out conducted.   Noted no overlying erythema, induration, or other signs of local infection.   Skin prepped in a sterile fashion.   Local anesthesia: Topical Ethyl chloride.   With sterile technique and under real time ultrasound guidance:  40 mg of Depo-Medrol, and 0.5 mL of lidocaine.  Total volume 1 mL injected easily.   Completed without difficulty   Pain immediately resolved suggesting accurate placement of the medication.   Advised to call if fevers/chills, erythema, induration, drainage, or persistent bleeding.   Images permanently stored and available for review in the ultrasound unit.  Impression: Technically successful ultrasound guided injection.       EXAM: RIGHT HAND - COMPLETE 3+ VIEW  COMPARISON:  None.  FINDINGS: No fracture or bone lesion.  Marginal spurring noted from the radial margin of the thumb IP joint and minimally from the radial margin of the thumb MCP joint. No joint space narrowing.  Minor marginal osteophytes are noted from the DIP joints of the index and middle fingers without significant joint space narrowing.  No periarticular erosions.  Remaining joints normally spaced and aligned.  Mild soft tissue swelling at the IP joint of the thumb.  IMPRESSION: 1. No fracture or bone lesion. 2. Mild arthropathic changes most evident at the IP joint of the thumb, reflected  by marginal osteophytes. No periarticular erosions or significant joint space narrowing. 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    Assessment and Plan: 48 y.o. female with right hand pain secondary to trigger thumb.  Patient had about 6 months of symptom relief following the last injection.  Plan to repeat injection and double band  splint.  If symptoms return would consider hand therapy versus referral to hand surgery.  Recheck as needed.   PDMP not reviewed this encounter. No orders of the defined types were placed in this encounter.  No orders of the defined types were placed in this encounter.   Historical information moved to improve visibility of documentation.  Past Medical History:  Diagnosis Date  . Essential hypertension 12/27/2018  . Hyperlipidemia    Past Surgical History:  Procedure Laterality Date  . ABDOMINAL HYSTERECTOMY    . APPENDECTOMY    . CHOLECYSTECTOMY     Social History   Tobacco Use  . Smoking status: Never Smoker  . Smokeless tobacco: Never Used  Substance Use Topics  . Alcohol use: No   family history includes Cancer in her maternal grandmother and mother; Heart attack in her mother; Hyperlipidemia in her father and mother; Hypertension in her father and mother; Stroke in her mother.  Medications: Current Outpatient Medications  Medication Sig Dispense Refill  . ALPRAZolam (XANAX) 0.5 MG tablet Take 1 tablet (0.5 mg total) by mouth 2 (two) times daily as needed for anxiety. 60 tablet 3  . atorvastatin (LIPITOR) 40 MG tablet Take 1 tablet (40 mg total) by mouth daily. 90 tablet 3  . buPROPion (WELLBUTRIN XL) 300 MG 24 hr tablet Take 1 tablet (300 mg total) by mouth daily. 90 tablet 3  . ibuprofen (ADVIL,MOTRIN) 800 MG tablet Take 1 tablet (800 mg total) by mouth every 8 (eight) hours as needed. 60 tablet 2  . lisinopril (ZESTRIL) 10 MG tablet Take 1 tablet (10 mg total) by mouth daily. 90 tablet 3  . methocarbamol (ROBAXIN) 500 MG tablet TAKE ONE TABLET BY MOUTH THREE TIMES A DAY 90 tablet 1  . pantoprazole (PROTONIX) 40 MG tablet Take 1 tablet (40 mg total) by mouth daily. 90 tablet 3  . zolpidem (AMBIEN) 10 MG tablet TAKE ONE TABLET BY MOUTH AT BEDTIME 30 tablet 5   No current facility-administered medications for this visit.    No Known Allergies    Discussed warning  signs or symptoms. Please see discharge instructions. Patient expresses understanding.

## 2019-08-07 MED ORDER — ALPRAZOLAM 0.5 MG PO TABS: 0.5000 mg | ORAL_TABLET | Freq: Two times a day (BID) | ORAL | 2 refills | Status: DC | PRN

## 2019-08-07 NOTE — Telephone Encounter (Signed)
I denied Xanax refill for being too early, but pharmacy called and it sounds like dates got off.   Patient was given #30 on 03/22/2019.  Xanax written on 04/06/2019 to be filled 04/21/2019, but she gets #60 a month, so they filled 04/07/2019. This RX had 3 refills. She filled 05/04/2019, 06/01/2019, and 07/05/2019. So she is due for refill. Please advise.

## 2019-08-07 NOTE — Addendum Note (Signed)
Addended byAnnamaria Helling on: 08/07/2019 11:52 AM   Modules accepted: Orders

## 2019-10-29 ENCOUNTER — Other Ambulatory Visit: Payer: Self-pay | Admitting: Physician Assistant

## 2019-10-29 DIAGNOSIS — F411 Generalized anxiety disorder: Secondary | ICD-10-CM

## 2019-10-29 NOTE — Telephone Encounter (Signed)
Last filled 08/06/2019 #60 with 2 refills  Last appt 04/06/2019

## 2019-10-30 NOTE — Telephone Encounter (Signed)
Needs appt. Will give 30 unitl appt.

## 2019-11-19 ENCOUNTER — Ambulatory Visit: Payer: 59 | Admitting: Physician Assistant

## 2019-11-23 ENCOUNTER — Encounter: Payer: Self-pay | Admitting: Physician Assistant

## 2019-11-23 ENCOUNTER — Other Ambulatory Visit: Payer: Self-pay

## 2019-11-23 ENCOUNTER — Ambulatory Visit (INDEPENDENT_AMBULATORY_CARE_PROVIDER_SITE_OTHER): Payer: 59 | Admitting: Physician Assistant

## 2019-11-23 VITALS — BP 133/86 | HR 103 | Ht 60.0 in | Wt 194.0 lb

## 2019-11-23 DIAGNOSIS — G8929 Other chronic pain: Secondary | ICD-10-CM

## 2019-11-23 DIAGNOSIS — F5101 Primary insomnia: Secondary | ICD-10-CM | POA: Diagnosis not present

## 2019-11-23 DIAGNOSIS — Z6837 Body mass index (BMI) 37.0-37.9, adult: Secondary | ICD-10-CM

## 2019-11-23 DIAGNOSIS — I2510 Atherosclerotic heart disease of native coronary artery without angina pectoris: Secondary | ICD-10-CM | POA: Diagnosis not present

## 2019-11-23 DIAGNOSIS — I10 Essential (primary) hypertension: Secondary | ICD-10-CM

## 2019-11-23 DIAGNOSIS — E6609 Other obesity due to excess calories: Secondary | ICD-10-CM

## 2019-11-23 DIAGNOSIS — F411 Generalized anxiety disorder: Secondary | ICD-10-CM

## 2019-11-23 DIAGNOSIS — M545 Low back pain: Secondary | ICD-10-CM

## 2019-11-23 DIAGNOSIS — K219 Gastro-esophageal reflux disease without esophagitis: Secondary | ICD-10-CM

## 2019-11-23 MED ORDER — LISINOPRIL 10 MG PO TABS: 10.0000 mg | ORAL_TABLET | Freq: Every day | ORAL | 3 refills | Status: DC

## 2019-11-23 MED ORDER — PANTOPRAZOLE SODIUM 40 MG PO TBEC: 40.0000 mg | DELAYED_RELEASE_TABLET | Freq: Every day | ORAL | 3 refills | Status: DC

## 2019-11-23 MED ORDER — ATORVASTATIN CALCIUM 40 MG PO TABS: 40.0000 mg | ORAL_TABLET | Freq: Every day | ORAL | 3 refills | Status: DC

## 2019-11-23 MED ORDER — ZOLPIDEM TARTRATE 10 MG PO TABS: 10.0000 mg | ORAL_TABLET | Freq: Every day | ORAL | 1 refills | Status: DC

## 2019-11-23 MED ORDER — CYCLOBENZAPRINE HCL 10 MG PO TABS: 10.0000 mg | ORAL_TABLET | Freq: Three times a day (TID) | ORAL | 5 refills | Status: DC | PRN

## 2019-11-23 MED ORDER — BUPROPION HCL ER (XL) 300 MG PO TB24: 300.0000 mg | ORAL_TABLET | Freq: Every day | ORAL | 3 refills | Status: DC

## 2019-11-23 MED ORDER — ALPRAZOLAM 0.5 MG PO TABS: 0.5000 mg | ORAL_TABLET | Freq: Two times a day (BID) | ORAL | 1 refills | Status: DC | PRN

## 2019-11-23 NOTE — Progress Notes (Signed)
Subjective:    Patient ID: Janice [name], female    DOB: 08-13-1971, 49 y.o.   MRN: [id number]  HPI Janice [name] is a 49 yo female with insomnia, MDD, GAD, HTN, GERD who comes in for medication refill.   Janice [name] is doing well. She continues to want to lose weight. She is not exercising or eating right. Her insurance does not pay for weight loss medications.  Her mood is good. Her GERD is controlled. She would like to switch back to flexeril for prn use instead of robaxin.   .. Active Ambulatory Problems    Diagnosis Date Noted  . MIGRAINE HEADACHE 11/03/2010  . Insomnia 02/26/2014  . Hypertriglyceridemia, essential 02/27/2014  . Atherosclerosis of coronary artery 08/02/2014  . Fatty liver disease, nonalcoholic [id number]  . Adenoma of right adrenal gland 08/02/2014  . H. pylori infection 08/05/2014  . Abnormal stress test 10/30/2014  . Adjustment disorder with mixed anxiety and depressed mood 03/12/2016  . Emotional crisis as acute reaction to exceptional (gross) stress 03/12/2016  . Class 2 obesity due to excess calories without serious comorbidity with body mass index (BMI) of 37.0 to 37.9 in adult 05/12/2016  . Elevated blood pressure reading 05/12/2016  . GAD (generalized anxiety disorder) 11/18/2016  . Right lateral epicondylitis 12/31/2016  . Dry heaves 11/25/2017  . LUQ abdominal pain 11/25/2017  . Mixed hyperlipidemia 05/11/2018  . 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 Empty sella ([place]) 06/07/2018  . Essential hypertension 12/27/2018  . Chronic bilateral low back pain without sciatica 11/26/2019  . Gastroesophageal reflux disease 11/26/2019   Resolved Ambulatory Problems    Diagnosis Date Noted  . Acute stress reaction 03/12/2016  . Muscle spasm of back 03/30/2016   Past Medical History:  Diagnosis Date  . Hyperlipidemia       Review of Systems See HPI.     Objective:   Physical Exam Vitals reviewed.  Constitutional:      Appearance: Normal appearance.  HENT:   Head: Normocephalic and atraumatic.  Cardiovascular:     Rate and Rhythm: Normal rate and regular rhythm.     Pulses: Normal pulses.  Pulmonary:     Effort: Pulmonary effort is normal.     Breath sounds: Normal breath sounds.  Abdominal:     General: Bowel sounds are normal. There is no distension.     Palpations: Abdomen is soft.     Tenderness: There is no abdominal tenderness. There is no guarding.  Neurological:     General: No focal deficit present.     Mental Status: She is alert and oriented to person, place, and time.  Psychiatric:        Mood and Affect: Mood normal.        Behavior: Behavior normal.     .. Depression screen [facility] 2/9 11/23/2019 04/06/2019 11/23/2017 06/07/2017 05/12/2016  Decreased Interest 1 1 1 1 1   Down, Depressed, Hopeless 1 0 1 1 1   PHQ - 2 Score 2 1 2 2 2   Altered sleeping 3 3 3 3 2   Tired, decreased energy 2 3 2 3 2   Change in appetite 3 3 1 3 1   Feeling bad or failure about yourself  0 1 0 1 1  Trouble concentrating 0 0 0 1 1  Moving slowly or fidgety/restless 0 0 0 0 0  Suicidal thoughts 0 0 0 0 0  PHQ-9 Score 10 11 8 13 9   Difficult doing work/chores - Somewhat difficult - - -   .[name]  GAD 7 : Generalized Anxiety Score 11/23/2019 04/06/2019 05/11/2018 06/07/2017  Nervous, Anxious, on Edge 1 3 0 1  Control/stop worrying 1 0 0 1  Worry too much - different things 1 3 0 1  Trouble relaxing 1 3 0 1  Restless 0 1 0 1  Easily annoyed or irritable 1 3 0 1  Afraid - awful might happen 0 1 0 0  Total GAD 7 Score 5 14 0 6  Anxiety Difficulty - Not difficult at all Not difficult at all Not difficult at all          Assessment & Plan:  Marland KitchenMarland KitchenAviela was seen today for medication management.  Diagnoses and all orders for this visit:  Primary insomnia -     zolpidem (AMBIEN) 10 MG tablet; Take 1 tablet (10 mg total) by mouth at bedtime.  Atherosclerosis of native coronary artery of native heart without angina pectoris -     atorvastatin (LIPITOR) 40 MG  tablet; Take 1 tablet (40 mg total) by mouth daily. -     Lipid Panel w/reflex Direct LDL  Essential hypertension -     lisinopril (ZESTRIL) 10 MG tablet; Take 1 tablet (10 mg total) by mouth daily. -     COMPLETE METABOLIC PANEL WITH GFR  GAD (generalized anxiety disorder) -     buPROPion (WELLBUTRIN XL) 300 MG 24 hr tablet; Take 1 tablet (300 mg total) by mouth daily. -     ALPRAZolam (XANAX) 0.5 MG tablet; Take 1 tablet (0.5 mg total) by mouth 2 (two) times daily as needed for anxiety. APPT FOR FURTHER REFILLS -     COMPLETE METABOLIC PANEL WITH GFR  Gastroesophageal reflux disease, unspecified whether esophagitis present -     pantoprazole (PROTONIX) 40 MG tablet; Take 1 tablet (40 mg total) by mouth daily.  Chronic bilateral low back pain without sciatica -     cyclobenzaprine (FLEXERIL) 10 MG tablet; Take 1 tablet (10 mg total) by mouth 3 (three) times daily as needed for muscle spasms.  Class 2 obesity due to excess calories without serious comorbidity with body mass index (BMI) of 37.0 to 37.9 in adult   Medications refills.  Discussed setting an active goal and discussed IF 16:8. HO given. Declined phenetermine due to use before and then weight gain after.   Xanax prn refilled. Continue to use sparingly.   Lorrin Mais does well for sleep. Refilled for 6 months.   Fasting labs ordered.   Discussed chronic back pain. Weight loss, good lifting techniques, exercises, massage, tens, icy hot all discussed today.

## 2019-11-26 ENCOUNTER — Encounter: Payer: Self-pay | Admitting: Physician Assistant

## 2019-11-26 DIAGNOSIS — M545 Low back pain, unspecified: Secondary | ICD-10-CM | POA: Insufficient documentation

## 2019-11-26 DIAGNOSIS — G8929 Other chronic pain: Secondary | ICD-10-CM | POA: Insufficient documentation

## 2019-11-26 DIAGNOSIS — K219 Gastro-esophageal reflux disease without esophagitis: Secondary | ICD-10-CM | POA: Insufficient documentation

## 2020-03-11 ENCOUNTER — Telehealth: Payer: Self-pay | Admitting: Neurology

## 2020-03-11 NOTE — Telephone Encounter (Signed)
Patient left a vm stating she is going out of town and needs permission to pick up medication once day early. Didn't leave the name of medication. Will contact patient back. [phone].

## 2020-03-11 NOTE — Telephone Encounter (Signed)
Patient states this is for Xanax. Last filled 11/23/2019 #180 with one refill. Leaving for a week tomorrow and wants to fill early. Please advise.

## 2020-03-12 NOTE — Telephone Encounter (Signed)
Called [facility] and okayed early refill. Called patient and made her aware.

## 2020-03-12 NOTE — Telephone Encounter (Signed)
Ok for early refill 

## 2020-03-18 ENCOUNTER — Encounter: Payer: Self-pay | Admitting: Nurse Practitioner

## 2020-03-18 ENCOUNTER — Telehealth (INDEPENDENT_AMBULATORY_CARE_PROVIDER_SITE_OTHER): Payer: 59 | Admitting: Nurse Practitioner

## 2020-03-18 VITALS — BP 179/111 | Temp 101.2°F | Ht 60.0 in | Wt 190.0 lb

## 2020-03-18 DIAGNOSIS — K529 Noninfective gastroenteritis and colitis, unspecified: Secondary | ICD-10-CM

## 2020-03-18 MED ORDER — PROMETHAZINE HCL 25 MG PO TABS: 25.0000 mg | ORAL_TABLET | Freq: Four times a day (QID) | ORAL | 2 refills | Status: DC | PRN

## 2020-03-18 NOTE — Patient Instructions (Signed)
I have reset your MyChart password to : welcome  All of this information and my note from the visit is accessible through [place].   Take small sips of fluids to avoid filling your stomach too quickly and causing more vomiting. It is best to drink oral rehydration fluids like Pedialyte. Gatorade is acceptable, but can sometimes cause diarrhea to be a little worse due to the sugar content.   Try to slowly increase your fluid intake. If you are able to hold down 1 ounce of liquid, the next hour you may increase to 2 ounces, then the next hour 3 ounces, and so on. If you sip slowly over the hour, rather than drinking all at once, this may help you tolerate the liquids better.   When you have gone at least 8 hours without vomiting, and you feel like eating, you may try to start some solid foods. I recommend starting with very bland foods. The BRAT diet is usually tolerated best. This includes: Bananas Rice Applesauce Toast  You may take phenergan for nausea and vomiting. This medication will likely make you very sleepy, so please do not drive while taking this medication.   If you are not running a fever over 101, you may take Imodium to slow down the diarrhea.   If you begin to have bloody diarrhea, severe abdominal pain, or fevers over 102, please let us know. If your symptoms last into Friday, please let us know, as you may need an antibiotic.    Nausea and Vomiting, Adult Nausea is the feeling that you have an upset stomach or that you are about to vomit. Vomiting is when stomach contents are thrown up and out of the mouth as a result of nausea. Vomiting can make you feel weak and cause you to become dehydrated. Dehydration can make you feel tired and thirsty, cause you to have a dry mouth, and decrease how often you urinate. Older adults and people with other diseases or a weak disease-fighting system (immune system) are at higher risk for dehydration. It is important to treat your nausea  and vomiting as told by your health care provider. Follow these instructions at home: Watch your symptoms for any changes. Tell your health care provider about them. Follow these instructions to care for yourself at home. Eating and drinking      Take an oral rehydration solution (ORS). This is a drink that is sold at pharmacies and retail stores.  Drink clear fluids slowly and in small amounts as you are able. Clear fluids include water, ice chips, low-calorie sports drinks, and fruit juice that has water added (diluted fruit juice).  Eat bland, easy-to-digest foods in small amounts as you are able. These foods include bananas, applesauce, rice, lean meats, toast, and crackers.  Avoid fluids that contain a lot of sugar or caffeine, such as energy drinks, sports drinks, and soda.  Avoid alcohol.  Avoid spicy or fatty foods. General instructions  Take over-the-counter and prescription medicines only as told by your health care provider.  Drink enough fluid to keep your urine pale yellow.  Wash your hands often using soap and water. If soap and water are not available, use hand sanitizer.  Make sure that all people in your household wash their hands well and often.  Rest at home while you recover.  Watch your condition for any changes.  Breathe slowly and deeply when you feel nauseated.  Keep all follow-up visits as told by your health care provider. This  is important. Contact a health care provider if:  Your symptoms get worse.  You have new symptoms.  You have a fever.  You cannot drink fluids without vomiting.  Your nausea does not go away after 2 days.  You feel light-headed or dizzy.  You have a headache.  You have muscle cramps.  You have a rash.  You have pain while urinating. Get help right away if:  You have pain in your chest, neck, arm, or jaw.  You feel extremely weak or you faint.  You have persistent vomiting.  You have vomit that is bright  red or looks like black coffee grounds.  You have bloody or black stools or stools that look like tar.  You have a severe headache, a stiff neck, or both.  You have severe pain, cramping, or bloating in your abdomen.  You have difficulty breathing, or you are breathing very quickly.  Your heart is beating very quickly.  Your skin feels cold and clammy.  You feel confused.  You have signs of dehydration, such as: ? Dark urine, very little urine, or no urine. ? Cracked lips. ? Dry mouth. ? Sunken eyes. ? Sleepiness. ? Weakness. These symptoms may represent a serious problem that is an emergency. Do not wait to see if the symptoms will go away. Get medical help right away. Call your local emergency services (911 in the [place] Do not drive yourself to the hospital. Summary  Nausea is the feeling that you have an upset stomach or that you are about to vomit. As nausea gets worse, it can lead to vomiting. Vomiting can make you feel weak and cause you to become dehydrated.  Follow instructions from your health care provider about eating and drinking to prevent dehydration.  Take over-the-counter and prescription medicines only as told by your health care provider.  Contact your health care provider if your symptoms get worse, or you have new symptoms.  Keep all follow-up visits as told by your health care provider. This is important. This information is not intended to replace advice given to you by your health care provider. Make sure you discuss any questions you have with your health care provider. Document Revised: 01/19/2019 Document Reviewed: 03/07/2018   Diarrhea, Adult Diarrhea is frequent loose and watery bowel movements. Diarrhea can make you feel weak and cause you to become dehydrated. Dehydration can make you tired and thirsty, cause you to have a dry mouth, and decrease how often you urinate. Diarrhea typically lasts 2-3 days. However, it can last longer if it is a  sign of something more serious. It is important to treat your diarrhea as told by your health care provider. Follow these instructions at home: Eating and drinking     Follow these recommendations as told by your health care provider:  Take an oral rehydration solution (ORS). This is an over-the-counter medicine that helps return your body to its normal balance of nutrients and water. It is found at pharmacies and retail stores.  Drink plenty of fluids, such as water, ice chips, diluted fruit juice, and low-calorie sports drinks. You can drink milk also, if desired.  Avoid drinking fluids that contain a lot of sugar or caffeine, such as energy drinks, sports drinks, and soda.  Eat bland, easy-to-digest foods in small amounts as you are able. These foods include bananas, applesauce, rice, lean meats, toast, and crackers.  Avoid alcohol.  Avoid spicy or fatty foods.  Medicines  Take over-the-counter and prescription medicines  only as told by your health care provider.  If you were prescribed an antibiotic medicine, take it as told by your health care provider. Do not stop using the antibiotic even if you start to feel better. General instructions   Wash your hands often using soap and water. If soap and water are not available, use a hand sanitizer. Others in the household should wash their hands as well. Hands should be washed: ? After using the toilet or changing a diaper. ? Before preparing, cooking, or serving food. ? While caring for a sick person or while visiting someone in a hospital.  Drink enough fluid to keep your urine pale yellow.  Rest at home while you recover.  Watch your condition for any changes.  Take a warm bath to relieve any burning or pain from frequent diarrhea episodes.  Keep all follow-up visits as told by your health care provider. This is important. Contact a health care provider if:  You have a fever.  Your diarrhea gets worse.  You have new  symptoms.  You cannot keep fluids down.  You feel light-headed or dizzy.  You have a headache.  You have muscle cramps. Get help right away if:  You have chest pain.  You feel extremely weak or you faint.  You have bloody or black stools or stools that look like tar.  You have severe pain, cramping, or bloating in your abdomen.  You have trouble breathing or you are breathing very quickly.  Your heart is beating very quickly.  Your skin feels cold and clammy.  You feel confused.  You have signs of dehydration, such as: ? Dark urine, very little urine, or no urine. ? Cracked lips. ? Dry mouth. ? Sunken eyes. ? Sleepiness. ? Weakness. Summary  Diarrhea is frequent loose and watery bowel movements. Diarrhea can make you feel weak and cause you to become dehydrated.  Drink enough fluids to keep your urine pale yellow.  Make sure that you wash your hands after using the toilet. If soap and water are not available, use hand sanitizer.  Contact a health care provider if your diarrhea gets worse or you have new symptoms.  Get help right away if you have signs of dehydration. This information is not intended to replace advice given to you by your health care provider. Make sure you discuss any questions you have with your health care provider. Document Revised: 02/13/2019 Document Reviewed: 03/03/2018 Elsevier Patient Education  2020 [place] Two Choices to Help Relieve Diarrhea, Adult When you have diarrhea, the foods you eat and your eating habits are very important. Choosing the right foods and drinks can help:  Relieve diarrhea.  Replace lost fluids and nutrients.  Prevent dehydration. What general guidelines should I follow?  Relieving diarrhea  Choose foods with less than 2 g or .07 oz. of fiber per serving.  Limit fats to less than 8 tsp (38 g or 1.34 oz.) a day.  Avoid the following: ? Foods and beverages sweetened with high-fructose corn  syrup, honey, or sugar alcohols such as xylitol, sorbitol, and mannitol. ? Foods that contain a lot of fat or sugar. ? Fried, greasy, or spicy foods. ? High-fiber grains, breads, and cereals. ? Raw fruits and vegetables.  Eat foods that are rich in probiotics. These foods include dairy products such as yogurt and fermented milk products. They help increase healthy bacteria in the stomach and intestines (gastrointestinal tract, or GI tract).  If you have  lactose intolerance, avoid dairy products. These may make your diarrhea worse.  Take medicine to help stop diarrhea (antidiarrheal medicine) only as told by your health care provider. Replacing nutrients  Eat small meals or snacks every 3-4 hours.  Eat bland foods, such as white rice, toast, or baked potato, until your diarrhea starts to get better. Gradually reintroduce nutrient-rich foods as tolerated or as told by your health care provider. This includes: ? Well-cooked protein foods. ? Peeled, seeded, and soft-cooked fruits and vegetables. ? Low-fat dairy products.  Take vitamin and mineral supplements as told by your health care provider. Preventing dehydration  Start by sipping water or a special solution to prevent dehydration (oral rehydration solution, ORS). Urine that is clear or pale yellow means that you are getting enough fluid.  Try to drink at least 8-10 cups of fluid each day to help replace lost fluids.  You may add other liquids in addition to water, such as clear juice or decaffeinated sports drinks, as tolerated or as told by your health care provider.  Avoid drinks with caffeine, such as coffee, tea, or soft drinks.  Avoid alcohol. What foods are recommended?     The items listed may not be a complete list. Talk with your health care provider about what dietary choices are best for you. Grains White rice. White, [place], or pita breads (fresh or toasted), including plain rolls, buns, or bagels. White pasta.  Saltine, soda, or graham crackers. Pretzels. Low-fiber cereal. Cooked cereals made with water (such as cornmeal, farina, or cream cereals). Plain muffins. Matzo. Melba toast. Zwieback. Vegetables Potatoes (without the skin). Most well-cooked and canned vegetables without skins or seeds. Tender lettuce. Fruits Apple sauce. Fruits canned in juice. Cooked apricots, cherries, grapefruit, peaches, pears, or plums. Fresh bananas and cantaloupe. Meats and other protein foods Baked or boiled chicken. Eggs. Tofu. Fish. Seafood. Smooth nut butters. Ground or well-cooked tender beef, ham, veal, lamb, pork, or poultry. Dairy Plain yogurt, kefir, and unsweetened liquid yogurt. Lactose-free milk, buttermilk, skim milk, or soy milk. Low-fat or nonfat hard cheese. Beverages Water. Low-calorie sports drinks. Fruit juices without pulp. Strained tomato and vegetable juices. Decaffeinated teas. Sugar-free beverages not sweetened with sugar alcohols. Oral rehydration solutions, if approved by your health care provider. Seasoning and other foods Bouillon, broth, or soups made from recommended foods. What foods are not recommended? The items listed may not be a complete list. Talk with your health care provider about what dietary choices are best for you. Grains Whole grain, whole wheat, bran, or rye breads, rolls, pastas, and crackers. Wild or brown rice. Whole grain or bran cereals. Barley. Oats and oatmeal. Corn tortillas or taco shells. Granola. Popcorn. Vegetables Raw vegetables. Fried vegetables. Cabbage, broccoli, Brussels sprouts, artichokes, baked beans, beet greens, corn, kale, legumes, peas, sweet potatoes, and yams. Potato skins. Cooked spinach and cabbage. Fruits Dried fruit, including raisins and dates. Raw fruits. Stewed or dried prunes. Canned fruits with syrup. Meat and other protein foods Fried or fatty meats. Deli meats. Chunky nut butters. Nuts and seeds. Beans and lentils. Berniece Salines. Hot dogs.  Sausage. Dairy High-fat cheeses. Whole milk, chocolate milk, and beverages made with milk, such as milk shakes. Half-and-half. Cream. sour cream. Ice cream. Beverages Caffeinated beverages (such as coffee, tea, soda, or energy drinks). Alcoholic beverages. Fruit juices with pulp. Prune juice. Soft drinks sweetened with high-fructose corn syrup or sugar alcohols. High-calorie sports drinks. Fats and oils Butter. Cream sauces. Margarine. Salad oils. Plain salad dressings. Olives. Avocados. Mayonnaise.  Sweets and desserts Sweet rolls, doughnuts, and sweet breads. Sugar-free desserts sweetened with sugar alcohols such as xylitol and sorbitol. Seasoning and other foods Honey. Hot sauce. Chili powder. Gravy. Cream-based or milk-based soups. Pancakes and waffles. Summary  When you have diarrhea, the foods you eat and your eating habits are very important.  Make sure you get at least 8-10 cups of fluid each day, or enough to keep your urine clear or pale yellow.  Eat bland foods and gradually reintroduce healthy, nutrient-rich foods as tolerated, or as told by your health care provider.  Avoid high-fiber, fried, greasy, or spicy foods. This information is not intended to replace advice given to you by your health care provider. Make sure you discuss any questions you have with your health care provider. Document Revised: 01/18/2019 Document Reviewed: 09/24/2016 Elsevier Patient Education  2020 [place] Patient Education  [employer].

## 2020-03-18 NOTE — Progress Notes (Signed)
Virtual Visit via MyChart Note  I connected with  Janice [name] on 03/18/20 at  1:30 PM EDT by the video enabled telemedicine application, MyChart, and verified that I am speaking with the correct person using two identifiers.   I introduced myself as a Designer, jewellery with the practice. We discussed the limitations of evaluation and management by telemedicine and the availability of in person appointments. The patient expressed understanding and agreed to proceed.  The patient is: at home I am: in the office  Subjective:    CC: nausea, vomiting, diarrhea x 2 days  HPI: Janice [name] is a 49 y.o. y/o female presenting via [facility] today for symptoms including nausea, vomiting, diarrhea, and headache that started about 0100 on Monday morning. She lives alone and does not know of any contaminated foods that could have made her sick. A supervisor was sick last week with similar symptoms and she feels she may have picked this up from them. Her temperature this morning was 101.2 orally.    She has been able to hold down very small amounts of Gatorade, but has not been able to hold down anything else. She denies bloody diarrhea, cough, shortness of breath, chest pain, or severe abdominal pain. She has been taking Motrin and Tylenol for her fever.    Past medical history, Surgical history, Family history not pertinant except as noted below, Social history, Allergies, and medications have been entered into the medical record, reviewed, and corrections made.   Review of Systems:  See HPI  Objective:    General: Speaking clearly in complete sentences without any shortness of breath.  Alert and oriented x3.  Normal judgment. She appears ill, but in no acute distress.    Impression and Recommendations:    1. Gastroenteritis Symptoms and presentation consistent with gastroenteritis of unknown etiology. At this time I suspect this is of viral origin. Information provided on symptom  management and recovery. Recommend that patient stay out of work until she has been fever free with no medication to bring the fever down for 24 hours and no vomiting or diarrhea for a minimum of 12 hours. Work note provided for Monday through Wednesday of this week- we can add more dates if her symptoms continue.   PLAN: -Small sips of oral rehydration solution with slowly increasing intake -When able to tolerate solids, start with BRAT diet -Promethazine as needed every 6 hours for nausea and vomiting -Imodium may be taken for diarrhea if no bloody diarrhea and no fever greater than 101 is present -Rest and hydrate as much as possible -Avoid going to work or out in public until your symptoms have resolved (24 hours no fever, at least 12 hours no vomiting or diarrhea). -If you are no better by Friday, will plan to start empiric antibiotics for possible bacterial etiology -If you begin to experience dizziness, shortness of breath, inability to keep food or liquid down, or altered mental status, please seek emergency care immediately.  -Follow-up if symptoms fail to improve by Friday.  - promethazine (PHENERGAN) 25 MG tablet; Take 1 tablet (25 mg total) by mouth every 6 (six) hours as needed for nausea or vomiting.  Dispense: 30 tablet; Refill: 2    I discussed the assessment and treatment plan with the patient. The patient was provided an opportunity to ask questions and all were answered. The patient agreed with the plan and demonstrated an understanding of the instructions.   The patient was advised to call back or  seek an in-person evaluation if the symptoms worsen or if the condition fails to improve as anticipated.  I provided 11 minutes of non-face-to-face interaction with this [place] visit.   Orma Render, NP

## 2020-03-30 IMAGING — CT CT HEAD W/O CM
3 series · 15 of 46 positions shown, 18 images · non-contrast
Comparison: Report from 08/01/2013

CLINICAL DATA: Headache and visual sensitivity since [REDACTED].

EXAM:
CT HEAD WITHOUT CONTRAST
TECHNIQUE: Contiguous axial images were obtained from the base of the skull
through the vertex without intravenous contrast.

[Series 2: head wo · axial · 0.40mm/px · z∈[-149,-29]mm · 9 of 29 slices shown, 12 images]
[im 3/29  brain]
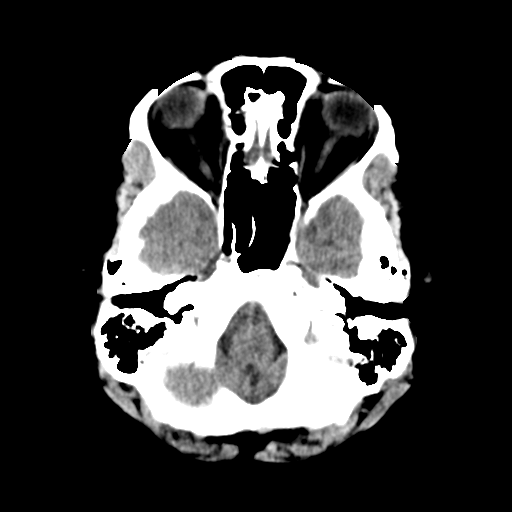
[im 3/29  bone]
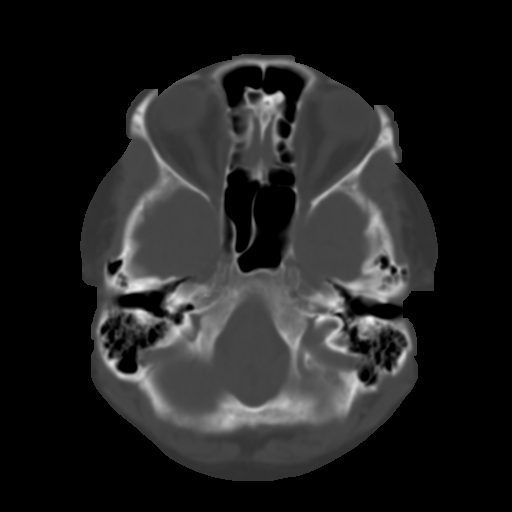
[im 6/29  brain]
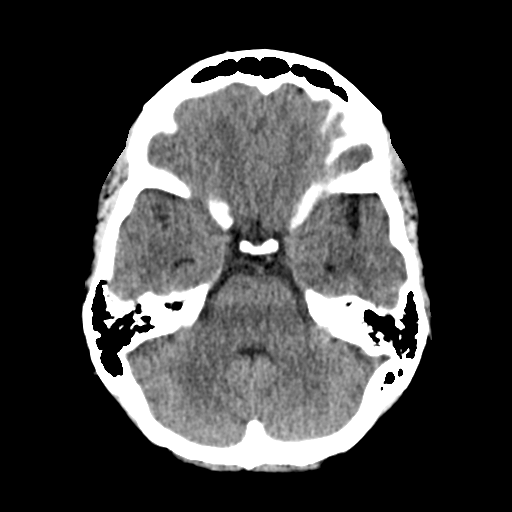
[im 9/29  brain]
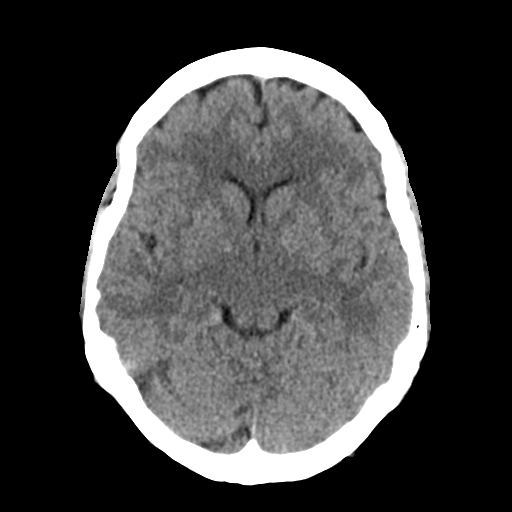
[im 12/29  brain]
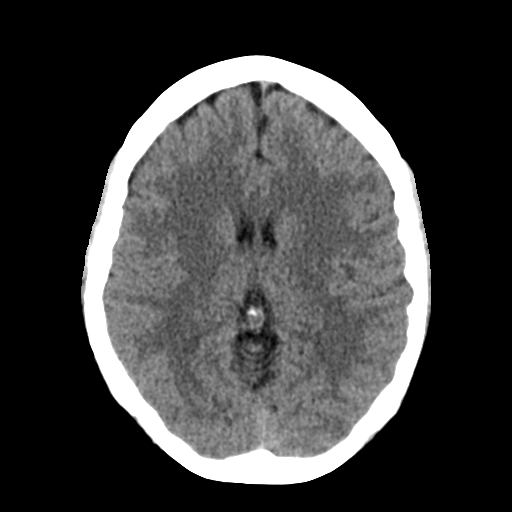
[im 15/29  brain]
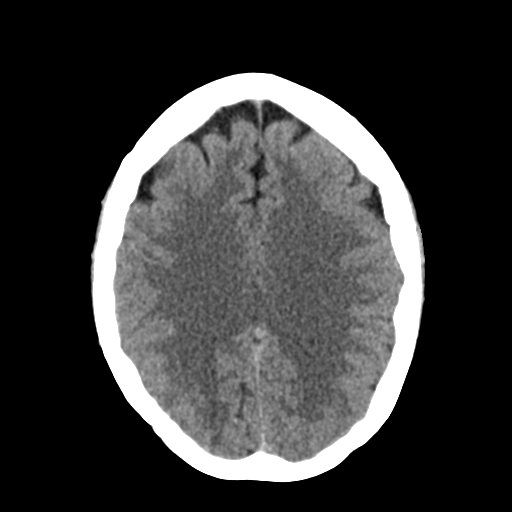
[im 15/29  bone]
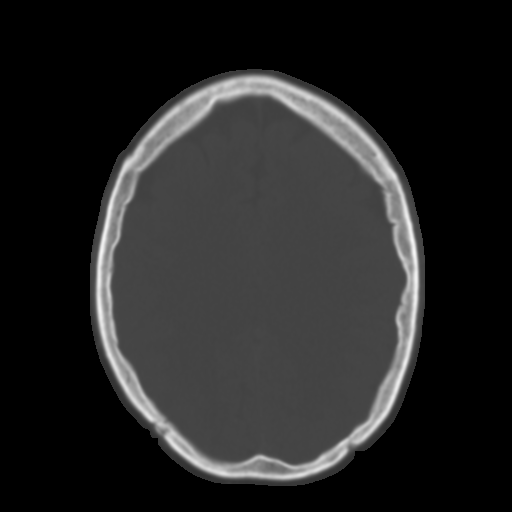
[im 18/29  brain]
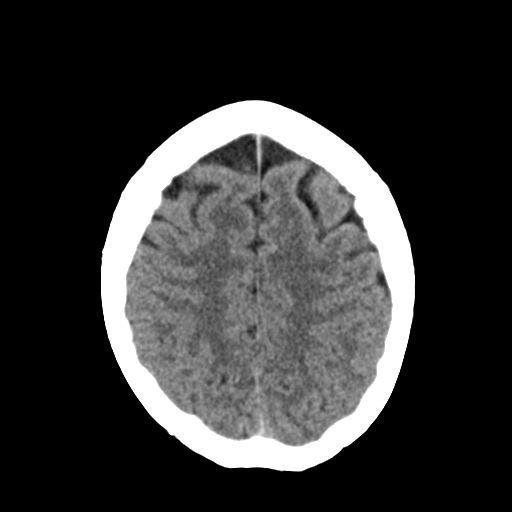
[im 21/29  brain]
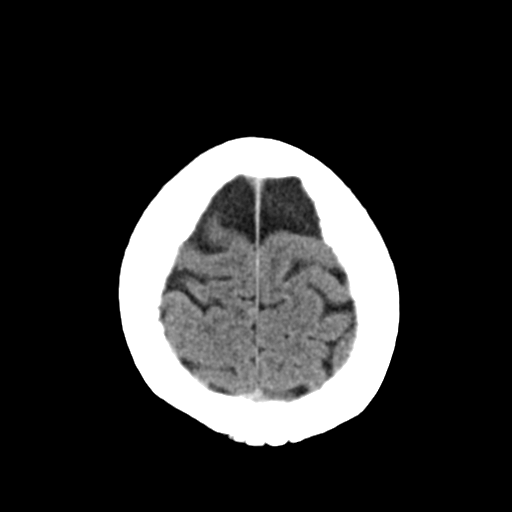
[im 24/29  brain]
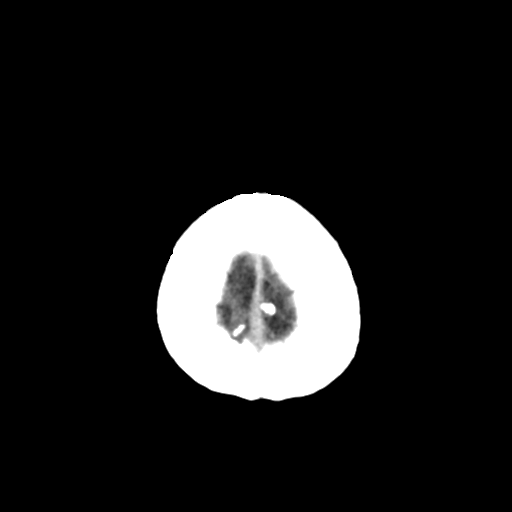
[im 27/29  brain]
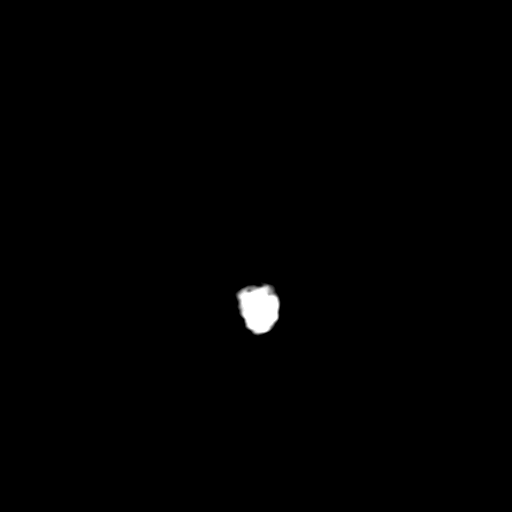
[im 27/29  bone]
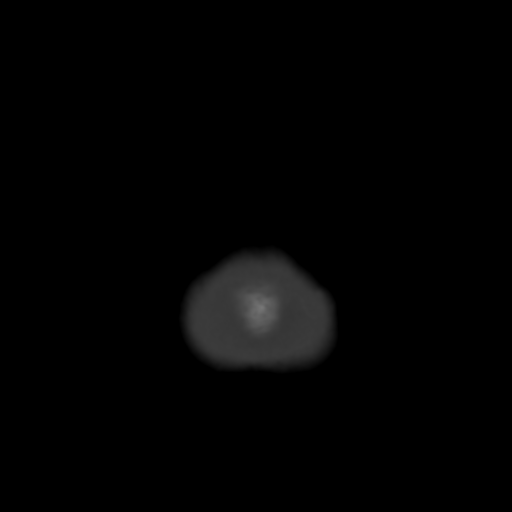

[Series 4: head wo coronal · coronal · 0.31mm/px · 3 of 67 slices shown]
[im 23/67  brain]
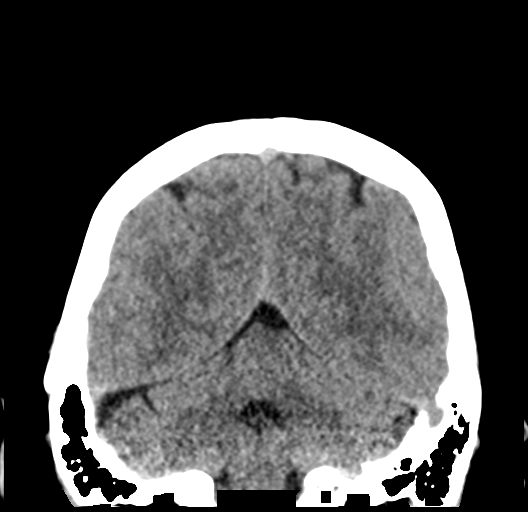
[im 30/67  brain]
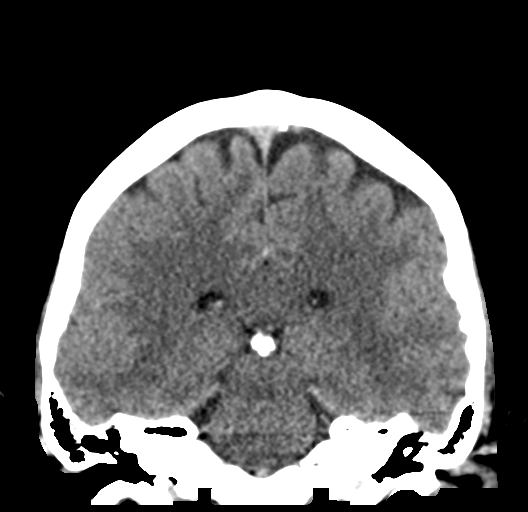
[im 37/67  brain]
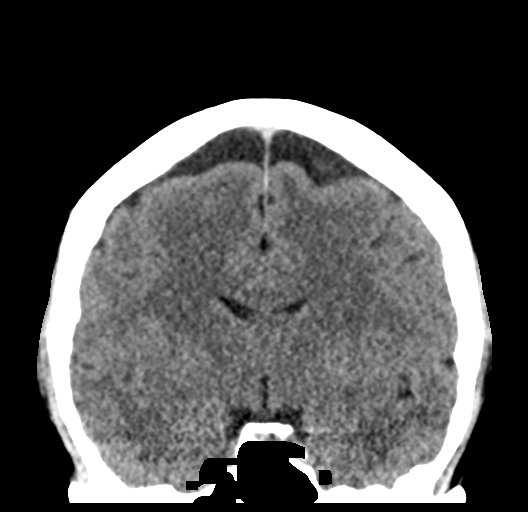

[Series 5: head wo sagittal · sagittal · 0.27mm/px · 3 of 55 slices shown]
[im 19/55  brain]
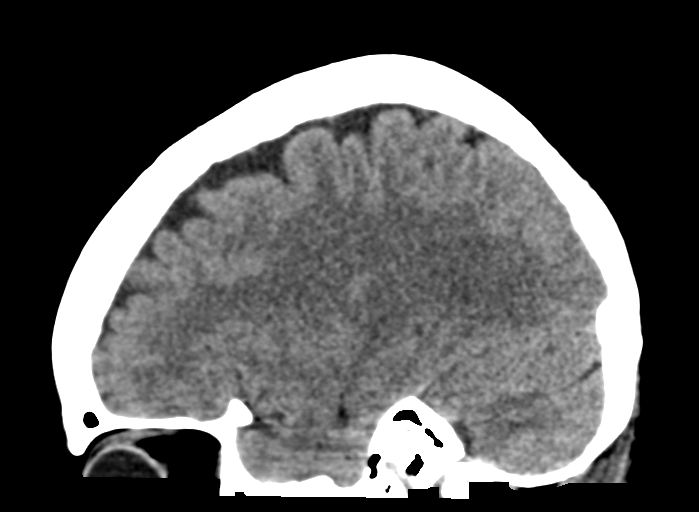
[im 28/55  brain]
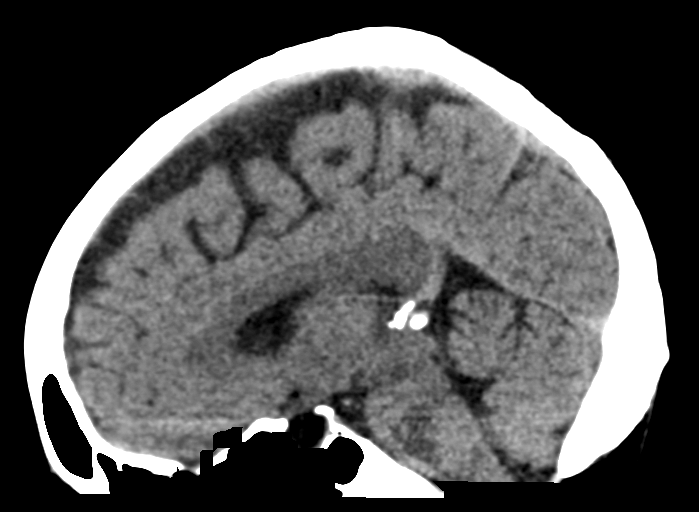
[im 37/55  brain]
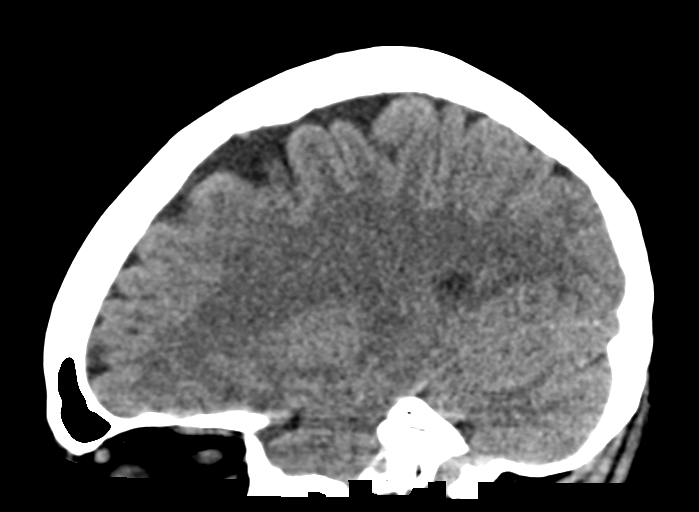

[15 of 46 positions shown; findings below may reference images not displayed]

FINDINGS: Brain: Partially empty sella, image [DATE].

Rim calcification along a likely small pineal cyst measuring up to 9
mm in long axis.

Otherwise, the brainstem, cerebellum, cerebral peduncles, thalami,
basal ganglia, basilar cisterns, and ventricular system appear
within normal limits. No intracranial hemorrhage, mass lesion, or
acute CVA.

Vascular: Unremarkable

Skull: Unremarkable

Sinuses/Orbits: Unremarkable

Other: No supplemental non-categorized findings.
IMPRESSION: 1. No specific abnormality to cause the patient's headache is
identified.
2. Note is made of a partially empty sella.
3. There is also a rim calcified pineal cyst which is likely
incidental/benign.

## 2020-05-05 ENCOUNTER — Other Ambulatory Visit: Payer: Self-pay | Admitting: Physician Assistant

## 2020-05-05 DIAGNOSIS — F411 Generalized anxiety disorder: Secondary | ICD-10-CM

## 2020-05-05 DIAGNOSIS — G8929 Other chronic pain: Secondary | ICD-10-CM

## 2020-05-05 DIAGNOSIS — F5101 Primary insomnia: Secondary | ICD-10-CM

## 2020-05-05 NOTE — Telephone Encounter (Signed)
Last appt 11/23/2019, told to follow up in 6 months (05/22/20) Meds last filled 11/23/19 Ambien #90 with 1 refill Xanax #180 with 1 refill Flexeril #90 with 5 refills  Sarrinah Gardin patient

## 2020-05-05 NOTE — Telephone Encounter (Signed)
Pt needs appt for her 6 month f/u. These are all scheduled drugs and are all sedatives. She HAS TO BE SEEN for futher refills.

## 2020-05-06 NOTE — Telephone Encounter (Signed)
Appointment has been made. No further questions at this time.  

## 2020-05-07 ENCOUNTER — Encounter: Payer: Self-pay | Admitting: Family Medicine

## 2020-05-07 ENCOUNTER — Other Ambulatory Visit: Payer: Self-pay

## 2020-05-07 ENCOUNTER — Ambulatory Visit (INDEPENDENT_AMBULATORY_CARE_PROVIDER_SITE_OTHER): Payer: 59 | Admitting: Family Medicine

## 2020-05-07 DIAGNOSIS — F5101 Primary insomnia: Secondary | ICD-10-CM

## 2020-05-07 DIAGNOSIS — I10 Essential (primary) hypertension: Secondary | ICD-10-CM

## 2020-05-07 DIAGNOSIS — M545 Low back pain: Secondary | ICD-10-CM

## 2020-05-07 DIAGNOSIS — F411 Generalized anxiety disorder: Secondary | ICD-10-CM | POA: Diagnosis not present

## 2020-05-07 DIAGNOSIS — G8929 Other chronic pain: Secondary | ICD-10-CM

## 2020-05-07 MED ORDER — ZOLPIDEM TARTRATE 10 MG PO TABS: 10.0000 mg | ORAL_TABLET | Freq: Every day | ORAL | 1 refills | Status: DC

## 2020-05-07 MED ORDER — ALPRAZOLAM 0.5 MG PO TABS: 0.5000 mg | ORAL_TABLET | Freq: Two times a day (BID) | ORAL | 1 refills | Status: DC | PRN

## 2020-05-07 MED ORDER — CYCLOBENZAPRINE HCL 10 MG PO TABS: 10.0000 mg | ORAL_TABLET | Freq: Three times a day (TID) | ORAL | 5 refills | Status: DC | PRN

## 2020-05-07 NOTE — Assessment & Plan Note (Signed)
BP elevated today.  Return in 4 weeks for recheck at nurse visit.

## 2020-05-07 NOTE — Progress Notes (Signed)
Janice [name] - 49 y.o. female MRN [id number]  Date of birth: April 12, 1971  Subjective Chief Complaint  Patient presents with  . Medication Refill    HPI Janice [name] is a 49 y.o. female with history of HTN, GAD and chronic low back pain here today for follow up and medication refills.   She reports that anxiety remains well controlled with bupropion daily with alprazolam as needed.  She does also take ambien for chronic insomnia. She denies significant side effects with current medications including oversedation.    Low back pain managed with ibuprofen and flexeril as needed.  Fairly good control of symptoms with this without side effects.    ROS:  A comprehensive ROS was completed and negative except as noted per HPI  No Known Allergies  Past Medical History:  Diagnosis Date  . Essential hypertension 12/27/2018  . Hyperlipidemia     Past Surgical History:  Procedure Laterality Date  . ABDOMINAL HYSTERECTOMY    . APPENDECTOMY    . CHOLECYSTECTOMY      Social History   Socioeconomic History  . Marital status: Married    Spouse name: Not on file  . Number of children: Not on file  . Years of education: Not on file  . Highest education level: Not on file  Occupational History  . Not on file  Tobacco Use  . Smoking status: Never Smoker  . Smokeless tobacco: Never Used  Substance and Sexual Activity  . Alcohol use: No  . Drug use: No  . Sexual activity: Yes  Other Topics Concern  . Not on file  Social History Narrative  . Not on file   Social Determinants of Health   Financial Resource Strain:   . Difficulty of Paying Living Expenses:   Food Insecurity:   . Worried About Charity fundraiser in the Last Year:   . Arboriculturist in the Last Year:   Transportation Needs:   . Film/video editor (Medical):   [name] Lack of Transportation (Non-Medical):   Physical Activity:   . Days of Exercise per Week:   . Minutes of Exercise per Session:   Stress:   .  Feeling of Stress :   Social Connections:   . Frequency of Communication with Friends and Family:   . Frequency of Social Gatherings with Friends and Family:   . Attends Religious Services:   . Active Member of Clubs or Organizations:   . Attends Archivist Meetings:   [name] Marital Status:     Family History  Problem Relation Age of Onset  . Cancer Mother   . Heart attack Mother   . Hyperlipidemia Mother   . Hypertension Mother   . Stroke Mother   . Cancer Maternal Grandmother   . Hyperlipidemia Father   . Hypertension Father     Health Maintenance  Topic Date Due  . HIV Screening  11/22/2020 (Originally 06/23/1985)  . Hepatitis C Screening  03/18/2021 (Originally [birth year])  . INFLUENZA VACCINE  05/11/2020  . TETANUS/TDAP  02/27/2024  . COVID-19 Vaccine  Completed     ----------------------------------------------------------------------------------------------------------------------------------------------------------------------------------------------------------------- Physical Exam BP (!) 141/95   Pulse 56   Temp 97.7 F (36.5 C) (Temporal)   Ht 5' (1.524 m)   Wt 193 lb (87.5 kg)   SpO2 99%   BMI 37.69 kg/m   Physical Exam Constitutional:      Appearance: Normal appearance.  HENT:     Head: Normocephalic and atraumatic.  Eyes:  General: No scleral icterus. Cardiovascular:     Rate and Rhythm: Normal rate and regular rhythm.  Pulmonary:     Effort: Pulmonary effort is normal.     Breath sounds: Normal breath sounds.  Musculoskeletal:     Cervical back: Neck supple.  Skin:    General: Skin is warm and dry.  Neurological:     General: No focal deficit present.     Mental Status: She is alert.  Psychiatric:        Mood and Affect: Mood normal.        Behavior: Behavior normal.      ------------------------------------------------------------------------------------------------------------------------------------------------------------------------------------------------------------------- Assessment and Plan  GAD (generalized anxiety disorder) Stable with combination of bupropion daily and alprazolam as needed.  Will continue at current strength with follow up in 6 months.  PDMP reviewed.    Essential hypertension BP elevated today.  Return in 4 weeks for recheck at nurse visit.   Insomnia Well controlled with Lorrin Mais, continue at current strength.    Chronic bilateral low back pain without sciatica Continue ibuprofen with flexeril as needed.  Flexeril rx renewed.     Meds ordered this encounter  Medications  . zolpidem (AMBIEN) 10 MG tablet    Sig: Take 1 tablet (10 mg total) by mouth at bedtime.    Dispense:  90 tablet    Refill:  1  . cyclobenzaprine (FLEXERIL) 10 MG tablet    Sig: Take 1 tablet (10 mg total) by mouth 3 (three) times daily as needed for muscle spasms.    Dispense:  90 tablet    Refill:  5  . ALPRAZolam (XANAX) 0.5 MG tablet    Sig: Take 1 tablet (0.5 mg total) by mouth 2 (two) times daily as needed for anxiety.    Dispense:  180 tablet    Refill:  1    Return in about 6 months (around 11/07/2020) for Anxiety/HTN/HLD.    This visit occurred during the SARS-CoV-2 public health emergency.  Safety protocols were in place, including screening questions prior to the visit, additional usage of staff PPE, and extensive cleaning of exam room while observing appropriate contact time as indicated for disinfecting solutions.

## 2020-05-07 NOTE — Patient Instructions (Signed)
Nice to meet you  Continue current medications Follow up in about 6 months.

## 2020-05-07 NOTE — Assessment & Plan Note (Signed)
Continue ibuprofen with flexeril as needed.  Flexeril rx renewed.

## 2020-05-07 NOTE — Assessment & Plan Note (Signed)
Well controlled with Lorrin Mais, continue at current strength.

## 2020-05-07 NOTE — Assessment & Plan Note (Signed)
Stable with combination of bupropion daily and alprazolam as needed.  Will continue at current strength with follow up in 6 months.  PDMP reviewed.

## 2020-06-06 ENCOUNTER — Ambulatory Visit (INDEPENDENT_AMBULATORY_CARE_PROVIDER_SITE_OTHER): Payer: 59 | Admitting: Physician Assistant

## 2020-06-06 DIAGNOSIS — Z5329 Procedure and treatment not carried out because of patient's decision for other reasons: Secondary | ICD-10-CM

## 2020-06-06 NOTE — Progress Notes (Signed)
No show

## 2020-08-27 ENCOUNTER — Ambulatory Visit (INDEPENDENT_AMBULATORY_CARE_PROVIDER_SITE_OTHER): Payer: 59 | Admitting: Physician Assistant

## 2020-08-27 ENCOUNTER — Encounter: Payer: Self-pay | Admitting: Physician Assistant

## 2020-08-27 ENCOUNTER — Other Ambulatory Visit: Payer: Self-pay

## 2020-08-27 VITALS — BP 131/79 | HR 90 | Ht 60.0 in | Wt 194.0 lb

## 2020-08-27 DIAGNOSIS — Z1211 Encounter for screening for malignant neoplasm of colon: Secondary | ICD-10-CM

## 2020-08-27 DIAGNOSIS — M79641 Pain in right hand: Secondary | ICD-10-CM

## 2020-08-27 DIAGNOSIS — R2231 Localized swelling, mass and lump, right upper limb: Secondary | ICD-10-CM | POA: Diagnosis not present

## 2020-08-27 DIAGNOSIS — Z1231 Encounter for screening mammogram for malignant neoplasm of breast: Secondary | ICD-10-CM

## 2020-08-27 MED ORDER — HYDROCODONE-ACETAMINOPHEN 5-325 MG PO TABS: 1.0000 | ORAL_TABLET | Freq: Three times a day (TID) | ORAL | 0 refills | Status: DC | PRN

## 2020-08-27 MED ORDER — PREDNISONE 20 MG PO TABS: ORAL_TABLET | ORAL | 0 refills | Status: DC

## 2020-08-27 NOTE — Progress Notes (Deleted)
Pain started two weeks ago History of right hand pain, has seen Dr. Georgina [name] in the past Taking Aleve/Flexeril with no relief  From Dr. Georgina [name] note 08/06/2019 Right thumb pain.  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  Treated with trigger thumb injection at A1 pulley in April.  Janice [name] notes that worked very well to control her pain Assessment and Plan: 49 y.o. female with right hand pain secondary to trigger thumb.  Patient had about 6 months of symptom relief following the last injection.  Plan to repeat injection and double band splint.  If symptoms return would consider hand therapy versus referral to hand surgery.  Recheck as needed.

## 2020-08-27 NOTE — Progress Notes (Signed)
Subjective:    Patient ID: Janice [name], female    DOB: 07-09-1971, 49 y.o.   MRN: [id number]  HPI  Patient is a 49 year old female with history of osteoarthritis and pain in right hand.  For the last 2 weeks her right hand pain has worsened.  She has seen Dr. Georgina [name] for this in the past.  Most of her pain has always been in her right thumb.  She has received injections which have helped in the past.  This new pain is in multiple joints.  Tends to be more PIP joint pain.  She rates the pain 9 out of 10 currently.  She feels like her hands are warm and swollen.  Aleve and Flexeril are not helping at all.  She took her mother's Percocet and that helped relieve her pain for a little bit.  She denies any injury.   From Dr. Georgina [name] note 08/06/2019 Right thumb pain.  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  Treated with trigger thumb injection at A1 pulley in April.  She notes that worked very well to control her pain Assessment and Plan: 49 y.o. female with right hand pain secondary to trigger thumb.  Patient had about 6 months of symptom relief following the last injection.  Plan to repeat injection and double band splint.  If symptoms return would consider hand therapy versus referral to hand surgery.  Recheck as needed.  .. Active Ambulatory Problems    Diagnosis Date Noted  . MIGRAINE HEADACHE 11/03/2010  . Insomnia 02/26/2014  . Hypertriglyceridemia, essential 02/27/2014  . Atherosclerosis of coronary artery 08/02/2014  . Fatty liver disease, nonalcoholic 74/25/9563  . Adenoma of right adrenal gland 08/02/2014  . H. pylori infection 08/05/2014  . Abnormal stress test 10/30/2014  . Adjustment disorder with mixed anxiety and depressed mood 03/12/2016  . Emotional crisis as acute reaction to exceptional (gross) stress 03/12/2016  . Class 2 obesity due to excess calories without serious comorbidity  with body mass index (BMI) of 37.0 to 37.9 in adult 05/12/2016  . Elevated blood pressure reading 05/12/2016  . GAD (generalized anxiety disorder) 11/18/2016  . Right lateral epicondylitis 12/31/2016  . Dry heaves 11/25/2017  . LUQ abdominal pain 11/25/2017  . Mixed hyperlipidemia 05/11/2018  . 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 Empty sella ([place]) 06/07/2018  . Essential hypertension 12/27/2018  . Chronic bilateral low back pain without sciatica 11/26/2019  . Gastroesophageal reflux disease 11/26/2019   Resolved Ambulatory Problems    Diagnosis Date Noted  . Acute stress reaction 03/12/2016  . Muscle spasm of back 03/30/2016   Past Medical History:  Diagnosis Date  . Hyperlipidemia       Review of Systems See HPI.     Objective:   Physical Exam Vitals reviewed.  Constitutional:      Appearance: Normal appearance. She is obese.  Cardiovascular:     Rate and Rhythm: Normal rate.     Pulses: Normal pulses.  Pulmonary:     Effort: Pulmonary effort is normal.  Musculoskeletal:     Comments: Right hand: Generalized warmth and swelling with more swelling along thumb and PIP joints of metatarsal. Very tender to touch.  Hand grip 0/5 due to pain.  Negative finkelstein.   Neurological:     General: No focal deficit present.     Mental Status: She is alert.  Assessment & Plan:  Marland KitchenMarland KitchenFia was seen today for hand pain.  Diagnoses and all orders for this visit:  Right hand pain -     Cyclic citrul peptide antibody, IgG -     Rheumatoid factor -     Sed Rate (ESR) -     predniSONE (DELTASONE) 20 MG tablet; Take 3 tablets for 3 days, take 2 tablets for 3 days, take 1 tablet for 3 days, take 1/2 tablet for 4 days. -     HYDROcodone-acetaminophen (NORCO/VICODIN) 5-325 MG tablet; Take 1 tablet by mouth every 8 (eight) hours as needed for up to 5 days for moderate pain. -     CBC with Differential/Platelet -     COMPLETE METABOLIC PANEL WITH GFR -     Uric  acid  Localized swelling on right hand -     Cyclic citrul peptide antibody, IgG -     Rheumatoid factor -     Sed Rate (ESR) -     predniSONE (DELTASONE) 20 MG tablet; Take 3 tablets for 3 days, take 2 tablets for 3 days, take 1 tablet for 3 days, take 1/2 tablet for 4 days. -     HYDROcodone-acetaminophen (NORCO/VICODIN) 5-325 MG tablet; Take 1 tablet by mouth every 8 (eight) hours as needed for up to 5 days for moderate pain. -     CBC with Differential/Platelet -     COMPLETE METABOLIC PANEL WITH GFR -     Uric acid  Visit for screening mammogram -     MM 3D SCREEN BREAST BILATERAL  Colon cancer screening -     Ambulatory referral to Gastroenterology   .Marland KitchenPDMP reviewed during this encounter. Small quantity of norco given for breakthrough pain.   Patient does have a history of osteoarthritis in the right hand.  With the more extensive right hand pain and swelling does seem like rheumatoid arthritis or gout could be a possibility.  She has had labs for RA back in 2015 but not since. Will recheck RA factor, ESR, CBC. Do think whether OA or RA prednisone will help significantly. I did give a small quantity of Norco for breakthrough pain. Consider follow-up with Janice [name] in the next 2 to 4 weeks if symptoms not improved or for further management of OA.   Needs mammogram and colonoscopy.

## 2020-09-19 ENCOUNTER — Telehealth: Payer: Self-pay | Admitting: Neurology

## 2020-09-19 ENCOUNTER — Other Ambulatory Visit: Payer: Self-pay | Admitting: Physician Assistant

## 2020-09-19 DIAGNOSIS — M79641 Pain in right hand: Secondary | ICD-10-CM

## 2020-09-19 DIAGNOSIS — R2231 Localized swelling, mass and lump, right upper limb: Secondary | ICD-10-CM

## 2020-09-19 MED ORDER — HYDROCODONE-ACETAMINOPHEN 5-325 MG PO TABS: 1.0000 | ORAL_TABLET | Freq: Three times a day (TID) | ORAL | 0 refills | Status: AC | PRN

## 2020-09-19 NOTE — Progress Notes (Signed)
..  PDMP reviewed during this encounter. I cannot continue to refill pain meds without having a complete work up and trying other things. Please make appt with Dr. Levonne Hubert quanity for breakthrough pain given today.

## 2020-09-19 NOTE — Telephone Encounter (Signed)
Patient called and left vm asking for something for pain for her thumb swelling. Seen 08/27/2020. Given small amount of pain meds at her visit, was told to get labs. These weren't done. Was also told to see sports med if continued pain. No further appts made. Please advise.

## 2020-09-19 NOTE — Telephone Encounter (Signed)
I cannot continue to give pain medications without work up and considering other treatment options. Need to make appt with Dr. Darene Lamer. I sent 10 tablets of norco to pharmacy.

## 2020-09-21 ENCOUNTER — Encounter: Payer: Self-pay | Admitting: Emergency Medicine

## 2020-09-21 ENCOUNTER — Other Ambulatory Visit: Payer: Self-pay

## 2020-09-21 ENCOUNTER — Emergency Department (INDEPENDENT_AMBULATORY_CARE_PROVIDER_SITE_OTHER)
Admission: EM | Admit: 2020-09-21 | Discharge: 2020-09-21 | Disposition: A | Discharge status: Home / Self Care | Payer: 59 | Source: Home / Self Care

## 2020-09-21 ENCOUNTER — Emergency Department (INDEPENDENT_AMBULATORY_CARE_PROVIDER_SITE_OTHER): Payer: 59

## 2020-09-21 DIAGNOSIS — M79641 Pain in right hand: Secondary | ICD-10-CM | POA: Diagnosis not present

## 2020-09-21 DIAGNOSIS — M79644 Pain in right finger(s): Secondary | ICD-10-CM

## 2020-09-21 DIAGNOSIS — M1811 Unilateral primary osteoarthritis of first carpometacarpal joint, right hand: Secondary | ICD-10-CM

## 2020-09-21 MED ORDER — METHYLPREDNISOLONE ACETATE 80 MG/ML IJ SUSP
80.0000 mg | Freq: Once | INTRAMUSCULAR | Status: AC
  Administered 2020-09-21: 13:00:00 80 mg via INTRAMUSCULAR

## 2020-09-21 MED ORDER — FAMOTIDINE 40 MG/5ML PO SUSR: 20.0000 mg | Freq: Every day | ORAL | 0 refills | Status: DC

## 2020-09-21 MED ORDER — MELOXICAM 7.5 MG PO TABS: 7.5000 mg | ORAL_TABLET | Freq: Every day | ORAL | 0 refills | Status: DC

## 2020-09-21 MED ORDER — PREDNISONE 20 MG PO TABS: ORAL_TABLET | ORAL | 0 refills | Status: DC

## 2020-09-21 NOTE — ED Provider Notes (Signed)
[facility]    CSN: [id number] Arrival date & time: 09/21/20  1130      History   Chief Complaint Chief Complaint  Patient presents with  . Hand Pain    Right thumb    HPI Janice [name] is a 49 y.o. female.   HPI  Janice [name] is a 49 y.o. 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 She is Right hand dominant. She has seen her PCP, Iran Planas PA-C, who has prescribed prednisone and vicodin. Pt was advised to f/u with Dr. Dianah Field, Sports Medicine but has not made that appointment yet.  She called her PCP on 09/19/20 requesting refill of pain medication. She has already completed the 3 days of vicodin.  She took Advil this morning around 9AM without relief. She cannot seen Dr. Georgina Snell, who she saw about 8 months ago for an injection until January.  Pt has not tried a brace yet.     Past Medical History:  Diagnosis Date  . Essential hypertension 12/27/2018  . Hyperlipidemia     Patient Active Problem List   Diagnosis Date Noted  . Chronic bilateral low back pain without sciatica 11/26/2019  . Gastroesophageal reflux disease 11/26/2019  . Essential hypertension 12/27/2018  . 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 Empty sella ([place]) 06/07/2018  . Mixed hyperlipidemia 05/11/2018  . Dry heaves 11/25/2017  . LUQ abdominal pain 11/25/2017  . Right lateral epicondylitis 12/31/2016  . GAD (generalized anxiety disorder) 11/18/2016  . Class 2 obesity due to excess calories without serious comorbidity with body mass index (BMI) of 37.0 to 37.9 in adult 05/12/2016  . Elevated blood pressure reading 05/12/2016  . Adjustment disorder with mixed anxiety and depressed mood 03/12/2016  . Emotional crisis as acute reaction to exceptional (gross) stress 03/12/2016  . Abnormal stress test 10/30/2014  . H. pylori infection 08/05/2014  . Atherosclerosis of coronary artery 08/02/2014  . Fatty  liver disease, nonalcoholic 97/67/3419  . Adenoma of right adrenal gland 08/02/2014  . Hypertriglyceridemia, essential 02/27/2014  . Insomnia 02/26/2014  . MIGRAINE HEADACHE 11/03/2010    Past Surgical History:  Procedure Laterality Date  . ABDOMINAL HYSTERECTOMY    . APPENDECTOMY    . CHOLECYSTECTOMY      OB History   No obstetric history on file.      Home Medications    Prior to Admission medications   Medication Sig Start Date End Date Taking? Authorizing Provider  atorvastatin (LIPITOR) 40 MG tablet Take 1 tablet (40 mg total) by mouth daily. 11/23/19  Yes Breeback, Jade L, PA-C  buPROPion (WELLBUTRIN XL) 300 MG 24 hr tablet Take 1 tablet (300 mg total) by mouth daily. 11/23/19  Yes Breeback, Jade L, PA-C  cyclobenzaprine (FLEXERIL) 10 MG tablet Take 1 tablet (10 mg total) by mouth 3 (three) times daily as needed for muscle spasms. 05/07/20  Yes Luetta Nutting, DO  pantoprazole (PROTONIX) 40 MG tablet Take 1 tablet (40 mg total) by mouth daily. 11/23/19  Yes Breeback, Jade L, PA-C  zolpidem (AMBIEN) 10 MG tablet Take 1 tablet (10 mg total) by mouth at bedtime. 05/07/20  Yes Luetta Nutting, DO  ALPRAZolam Duanne Moron) 0.5 MG tablet Take 1 tablet (0.5 mg total) by mouth 2 (two) times daily as needed for anxiety. 05/07/20   Luetta Nutting, DO  famotidine (PEPCID) 40 MG/5ML suspension Take 2.5 mLs (20 mg total) by mouth daily. 09/21/20 10/21/20  Noe Gens, PA-C  HYDROcodone-acetaminophen (NORCO/VICODIN) 5-325 MG tablet Take 1 tablet by mouth every 8 (eight) hours as needed for up to 5 days for moderate pain. Patient not taking: Reported on 09/21/2020 09/19/20 09/24/20  Donella Stade, PA-C  ibuprofen (ADVIL,MOTRIN) 800 MG tablet Take 1 tablet (800 mg total) by mouth every 8 (eight) hours as needed. Patient not taking: Reported on 09/21/2020 12/26/18   Iran Planas L, PA-C  lisinopril (ZESTRIL) 10 MG tablet Take 1 tablet (10 mg total) by mouth daily. 11/23/19   Breeback, Royetta Car, PA-C   meloxicam (MOBIC) 7.5 MG tablet Take 1-2 tablets (7.5-15 mg total) by mouth daily. 09/21/20   Noe Gens, PA-C  predniSONE (DELTASONE) 20 MG tablet 3 tabs po day one, then 2 po daily x 4 days 09/21/20   Noe Gens, PA-C  promethazine (PHENERGAN) 25 MG tablet Take 1 tablet (25 mg total) by mouth every 6 (six) hours as needed for nausea or vomiting. 03/18/20   Orma Render, NP    Family History Family History  Problem Relation Age of Onset  . Cancer Mother   . Heart attack Mother   . Hyperlipidemia Mother   . Hypertension Mother   . Stroke Mother   . Cancer Maternal Grandmother   . Hyperlipidemia Father   . Hypertension Father   . Healthy Sister   . Healthy Brother     Social History Social History   Tobacco Use  . Smoking status: Never Smoker  . Smokeless tobacco: Never Used  Substance Use Topics  . Alcohol use: No  . Drug use: No     Allergies   Patient has no known allergies.   Review of Systems Review of Systems  Musculoskeletal: Positive for arthralgias, joint swelling and myalgias.  Skin: Negative for color change and wound.     Physical Exam Triage Vital Signs ED Triage Vitals  Enc Vitals Group     BP 09/21/20 1235 (!) 145/95     Pulse Rate 09/21/20 1230 93     Resp 09/21/20 1230 15     Temp 09/21/20 1230 97.7 F (36.5 C)     Temp Source 09/21/20 1230 Oral     SpO2 09/21/20 1230 99 %     Weight 09/21/20 1231 194 lb (88 kg)     Height 09/21/20 1231 5' (1.524 m)     Head Circumference --      Peak Flow --      Pain Score 09/21/20 1230 6     Pain Loc --      Pain Edu? --      Excl. in [place]? --    No data found.  Updated Vital Signs BP (!) 145/95 Comment (BP Location): HTN - takes meds at night  Pulse 93   Temp 97.7 F (36.5 C) (Oral)   Resp 15   Ht 5' (1.524 m)   Wt 194 lb (88 kg)   SpO2 99%   BMI 37.89 kg/m   Visual Acuity Right Eye Distance:   Left Eye Distance:   Bilateral Distance:    Right Eye Near:   Left Eye Near:     Bilateral Near:     Physical Exam Vitals and nursing note reviewed.  Constitutional:      Appearance: Normal appearance. She is well-developed and well-nourished.  HENT:     Head: Normocephalic and atraumatic.  Eyes:     Extraocular Movements: EOM normal.  Cardiovascular:  Rate and Rhythm: Normal rate and regular rhythm.     Pulses:          Radial pulses are 2+ on the right side.  Pulmonary:     Effort: Pulmonary effort is normal.  Musculoskeletal:        General: Swelling and tenderness present. Normal range of motion.     Cervical back: Normal range of motion.     Comments: Right hand: mild edema, tenderness of thumb especially at first MCP joint but also over first and second metacarpals. Reduced grip strength due to pain.  Skin:    General: Skin is warm and dry.     Capillary Refill: Capillary refill takes less than 2 seconds.  Neurological:     Mental Status: She is alert and oriented to person, place, and time.     Sensory: No sensory deficit.  Psychiatric:        Mood and Affect: Mood and affect normal.        Behavior: Behavior normal.      UC Treatments / Results  Labs (all labs ordered are listed, but only abnormal results are displayed) Labs Reviewed - No data to display  EKG   Radiology DG Hand Complete Right  Result Date: 09/21/2020 CLINICAL DATA:  R thumb pain radiates to R hand - thobbing  pain EXAM: RIGHT HAND - COMPLETE 3+ VIEW COMPARISON:  01/11/2019 FINDINGS: Normal alignment. No fracture. Joint space loss, subchondral sclerosis and degenerative spurring involving the first IP and MCP joints, unchanged. Mild joint space loss and degenerative spurring involving the distal greater than proximal IP joints of the remaining fingers. Scaphoid lucency may reflect a geode. Mild soft tissue swelling. IMPRESSION: No acute osseous abnormality.  Mild soft tissue swelling. Mild osteoarthrosis predominantly involving the first IP and MCP joints, grossly  unchanged. Electronically Signed   By: Primitivo Gauze M.D.   On: 09/21/2020 13:16    Procedures Procedures (including critical care time)  Medications Ordered in UC Medications  methylPREDNISolone acetate (DEPO-MEDROL) injection 80 mg (80 mg Intramuscular Given 09/21/20 1328)    Initial Impression / Assessment and Plan / UC Course  I have reviewed the triage vital signs and the nursing notes.  Pertinent labs & imaging results that were available during my care of the patient were reviewed by me and considered in my medical decision making (see chart for details).     Reviewed imaging with pt Thumb spica splint applied for comfort. Encouraged f/u with Dr. Dianah Field or Dr. Raeford Razor who may be able to see her before January  No additional controlled pain medication given at this time due to chronic nature of pain. AVS given  Final Clinical Impressions(s) / UC Diagnoses   Final diagnoses:  Osteoarthritis of right thumb  Right hand pain     Discharge Instructions      Meloxicam (Mobic) is an antiinflammatory to help with pain and inflammation.  Do not take ibuprofen, Advil, Aleve, or any other medications that contain NSAIDs while taking meloxicam as this may cause stomach upset or even ulcers if taken in large amounts for an extended period of time.  Take the prescribed pepcid while taking the Mobic and prednisone to help prevent stomach ulcer or upset.  Take with food.  Due to the chronic nature of your pain, it is advised you call to schedule an appointment with Dr. Dianah Field, Sports Medicine or with Dr. Raeford Razor in [facility] to see if you can be scheduled this week rather  than waiting for January.       ED Prescriptions    Medication Sig Dispense Auth. Provider   predniSONE (DELTASONE) 20 MG tablet 3 tabs po day one, then 2 po daily x 4 days 11 tablet Christipher Rieger O, PA-C   meloxicam (MOBIC) 7.5 MG tablet Take 1-2 tablets (7.5-15 mg total) by mouth daily. 20  tablet Noe Gens, PA-C   famotidine (PEPCID) 40 MG/5ML suspension Take 2.5 mLs (20 mg total) by mouth daily. 75 mL Noe Gens, [place]     I have reviewed the PDMP during this encounter.   Noe Gens, PA-C 09/21/20 1350

## 2020-09-21 NOTE — Discharge Instructions (Addendum)
  Meloxicam (Mobic) is an antiinflammatory to help with pain and inflammation.  Do not take ibuprofen, Advil, Aleve, or any other medications that contain NSAIDs while taking meloxicam as this may cause stomach upset or even ulcers if taken in large amounts for an extended period of time.  Take the prescribed pepcid while taking the Mobic and prednisone to help prevent stomach ulcer or upset.  Take with food.  Due to the chronic nature of your pain, it is advised you call to schedule an appointment with Dr. Dianah Field, Sports Medicine or with Dr. Raeford Razor in [facility] to see if you can be scheduled this week rather than waiting for January.

## 2020-09-21 NOTE — ED Triage Notes (Signed)
Chronic pain to R  thumb since early November - saw Jade on 08/27/20 Has an appoint w/ Dr Georgina Snell on 10/14/19 Pain flares up - has had 2 cortisone shots in the past  Pain is throbbing w/swelling J & J vaccine May 2021/booster 3 weeks ago

## 2020-09-22 NOTE — Telephone Encounter (Signed)
Patient saw urgent care yesterday for evaluation and was told to follow up with Dr. Darene Lamer.

## 2020-10-22 ENCOUNTER — Other Ambulatory Visit: Payer: Self-pay | Admitting: Physician Assistant

## 2020-10-22 DIAGNOSIS — G8929 Other chronic pain: Secondary | ICD-10-CM

## 2020-10-22 DIAGNOSIS — F411 Generalized anxiety disorder: Secondary | ICD-10-CM

## 2020-10-23 NOTE — Telephone Encounter (Signed)
Last written 05/07/2020 #180 with 1 refill Last appt 09/19/2020 (acute visit) 05/07/2020 for anxiety Pended with note that patient needs appt

## 2020-10-24 ENCOUNTER — Ambulatory Visit: Payer: 59 | Admitting: Physician Assistant

## 2020-10-28 ENCOUNTER — Other Ambulatory Visit: Payer: Self-pay

## 2020-10-28 ENCOUNTER — Telehealth: Payer: Self-pay

## 2020-10-28 DIAGNOSIS — F5101 Primary insomnia: Secondary | ICD-10-CM

## 2020-10-28 MED ORDER — ZOLPIDEM TARTRATE 10 MG PO TABS: 10.0000 mg | ORAL_TABLET | Freq: Every day | ORAL | 0 refills | Status: DC

## 2020-10-28 NOTE — Telephone Encounter (Signed)
Pt called stating she needs a refill on her Ambien until her  upcoming appt on the 28th of January.

## 2020-10-28 NOTE — Telephone Encounter (Signed)
Sent to pharmacy 

## 2020-10-28 NOTE — Telephone Encounter (Signed)
Pt is advised of medication sent to pharmacy.

## 2020-11-06 ENCOUNTER — Ambulatory Visit: Payer: 59

## 2020-11-07 ENCOUNTER — Ambulatory Visit: Payer: 59 | Admitting: Physician Assistant

## 2020-11-12 ENCOUNTER — Ambulatory Visit: Payer: 59 | Admitting: Physician Assistant

## 2020-11-20 ENCOUNTER — Telehealth: Payer: Self-pay | Admitting: Neurology

## 2020-11-20 NOTE — Telephone Encounter (Signed)
Patient left VM wanting refill on Xanax before appt on Monday.   This was sent 10/24/2020 with a refill. LMOM letting patient know refill should be available at pharmacy.

## 2020-11-24 ENCOUNTER — Ambulatory Visit (INDEPENDENT_AMBULATORY_CARE_PROVIDER_SITE_OTHER): Payer: 59 | Admitting: Physician Assistant

## 2020-11-24 ENCOUNTER — Encounter: Payer: Self-pay | Admitting: Physician Assistant

## 2020-11-24 ENCOUNTER — Other Ambulatory Visit: Payer: Self-pay

## 2020-11-24 VITALS — BP 148/87 | HR 100 | Ht 60.0 in | Wt 201.0 lb

## 2020-11-24 DIAGNOSIS — R110 Nausea: Secondary | ICD-10-CM

## 2020-11-24 DIAGNOSIS — I2510 Atherosclerotic heart disease of native coronary artery without angina pectoris: Secondary | ICD-10-CM | POA: Diagnosis not present

## 2020-11-24 DIAGNOSIS — K219 Gastro-esophageal reflux disease without esophagitis: Secondary | ICD-10-CM

## 2020-11-24 DIAGNOSIS — K529 Noninfective gastroenteritis and colitis, unspecified: Secondary | ICD-10-CM

## 2020-11-24 DIAGNOSIS — F411 Generalized anxiety disorder: Secondary | ICD-10-CM

## 2020-11-24 DIAGNOSIS — E782 Mixed hyperlipidemia: Secondary | ICD-10-CM

## 2020-11-24 DIAGNOSIS — I10 Essential (primary) hypertension: Secondary | ICD-10-CM | POA: Diagnosis not present

## 2020-11-24 DIAGNOSIS — F5101 Primary insomnia: Secondary | ICD-10-CM

## 2020-11-24 MED ORDER — BUPROPION HCL ER (XL) 300 MG PO TB24: 300.0000 mg | ORAL_TABLET | Freq: Every day | ORAL | 1 refills | Status: DC

## 2020-11-24 MED ORDER — LISINOPRIL 20 MG PO TABS: 20.0000 mg | ORAL_TABLET | Freq: Every day | ORAL | 1 refills | Status: DC

## 2020-11-24 MED ORDER — ATORVASTATIN CALCIUM 40 MG PO TABS: 40.0000 mg | ORAL_TABLET | Freq: Every day | ORAL | 3 refills | Status: DC

## 2020-11-24 MED ORDER — PANTOPRAZOLE SODIUM 40 MG PO TBEC: 40.0000 mg | DELAYED_RELEASE_TABLET | Freq: Every day | ORAL | 3 refills | Status: DC

## 2020-11-24 MED ORDER — ZOLPIDEM TARTRATE 10 MG PO TABS: 10.0000 mg | ORAL_TABLET | Freq: Every day | ORAL | 5 refills | Status: DC

## 2020-11-24 MED ORDER — ALPRAZOLAM 0.5 MG PO TABS: ORAL_TABLET | ORAL | 5 refills | Status: DC

## 2020-11-24 MED ORDER — PROMETHAZINE HCL 25 MG PO TABS: 25.0000 mg | ORAL_TABLET | Freq: Four times a day (QID) | ORAL | 2 refills | Status: DC | PRN

## 2020-11-24 NOTE — Progress Notes (Signed)
Subjective:    Patient ID: Janice [name], female    DOB: [birth year], 50 y.o.   MRN: [id number]  HPI  Patient is a 50 year old obese female with hypertension, CAD, fatty liver, GERD, anxiety, insomnia who presents to the clinic for medication refills.  Overall patient is doing well.  She denies any chest pain, palpitations, headache, vision changes, abdominal pain, dizziness.  Her anxiety is doing pretty well.  She is in a healthy relationship.  She is sleeping well. She is not checking her BP.   Her reflux is controlled.    .. Active Ambulatory Problems    Diagnosis Date Noted  . MIGRAINE HEADACHE 11/03/2010  . Insomnia 02/26/2014  . Hypertriglyceridemia, essential 02/27/2014  . Atherosclerosis of coronary artery 08/02/2014  . Fatty liver disease, nonalcoholic 79/11/4095  . Adenoma of right adrenal gland 08/02/2014  . H. pylori infection 08/05/2014  . Abnormal stress test 10/30/2014  . Adjustment disorder with mixed anxiety and depressed mood 03/12/2016  . Emotional crisis as acute reaction to exceptional (gross) stress 03/12/2016  . Class 2 obesity due to excess calories without serious comorbidity with body mass index (BMI) of 37.0 to 37.9 in adult 05/12/2016  . Elevated blood pressure reading 05/12/2016  . GAD (generalized anxiety disorder) 11/18/2016  . Right lateral epicondylitis 12/31/2016  . Dry heaves 11/25/2017  . LUQ abdominal pain 11/25/2017  . Mixed hyperlipidemia 05/11/2018  . 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 Empty sella ([place]) 06/07/2018  . Essential hypertension 12/27/2018  . Chronic bilateral low back pain without sciatica 11/26/2019  . Gastroesophageal reflux disease 11/26/2019  . Nausea 11/28/2020   Resolved Ambulatory Problems    Diagnosis Date Noted  . Acute stress reaction 03/12/2016  . Muscle spasm of back 03/30/2016   Past Medical History:  Diagnosis Date  . Hyperlipidemia     Review of Systems  All other systems reviewed and are  negative.      Objective:   Physical Exam Vitals reviewed.  Constitutional:      Appearance: Normal appearance. She is obese.  Cardiovascular:     Rate and Rhythm: Normal rate and regular rhythm.     Pulses: Normal pulses.     Heart sounds: Normal heart sounds.  Pulmonary:     Effort: Pulmonary effort is normal.     Breath sounds: Normal breath sounds.  Musculoskeletal:     Right lower leg: No edema.     Left lower leg: No edema.  Neurological:     General: No focal deficit present.     Mental Status: She is alert and oriented to person, place, and time.  Psychiatric:        Mood and Affect: Mood normal.           Assessment & Plan:  Marland KitchenMarland KitchenBethzy was seen today for follow-up.  Diagnoses and all orders for this visit:  Essential hypertension -     lisinopril (ZESTRIL) 20 MG tablet; Take 1 tablet (20 mg total) by mouth daily.  Atherosclerosis of native coronary artery of native heart without angina pectoris -     atorvastatin (LIPITOR) 40 MG tablet; Take 1 tablet (40 mg total) by mouth daily. -     Lipid Panel w/reflex Direct LDL  GAD (generalized anxiety disorder) -     buPROPion (WELLBUTRIN XL) 300 MG 24 hr tablet; Take 1 tablet (300 mg total) by mouth daily. -     ALPRAZolam (XANAX) 0.5 MG tablet; TAKE ONE TABLET BY MOUTH TWICE  A DAY AS NEEDED FOR ANXIETY .  Nausea -     promethazine (PHENERGAN) 25 MG tablet; Take 1 tablet (25 mg total) by mouth every 6 (six) hours as needed for nausea or vomiting.  Gastroesophageal reflux disease, unspecified whether esophagitis present -     pantoprazole (PROTONIX) 40 MG tablet; Take 1 tablet (40 mg total) by mouth daily.  Primary insomnia -     zolpidem (AMBIEN) 10 MG tablet; Take 1 tablet (10 mg total) by mouth at bedtime.  Mixed hyperlipidemia -     Cancel: Lipid Panel w/reflex Direct LDL -     Lipid Panel w/reflex Direct LDL   BP not to goal. Increased lisinopril to 20mg  daily. Check at home. Follow up in 3 months.    GeRD/insomnia/Anxiety doing well and medications refilled.   Needs labs. Ordered today.

## 2020-11-28 ENCOUNTER — Encounter: Payer: Self-pay | Admitting: Medical-Surgical

## 2020-11-28 ENCOUNTER — Encounter: Payer: Self-pay | Admitting: Physician Assistant

## 2020-11-28 ENCOUNTER — Telehealth (INDEPENDENT_AMBULATORY_CARE_PROVIDER_SITE_OTHER): Payer: 59 | Admitting: Medical-Surgical

## 2020-11-28 VITALS — BP 146/73 | Temp 101.2°F

## 2020-11-28 DIAGNOSIS — J069 Acute upper respiratory infection, unspecified: Secondary | ICD-10-CM

## 2020-11-28 DIAGNOSIS — R110 Nausea: Secondary | ICD-10-CM | POA: Insufficient documentation

## 2020-11-28 MED ORDER — IPRATROPIUM BROMIDE 0.03 % NA SOLN: 2.0000 | Freq: Two times a day (BID) | NASAL | 0 refills | Status: DC

## 2020-11-28 NOTE — Progress Notes (Signed)
Virtual Visit via Video Note  I connected with Janice [name] on 11/28/20 at 11:10 AM EST by a video enabled telemedicine application and verified that I am speaking with the correct person using two identifiers.   I discussed the limitations of evaluation and management by telemedicine and the availability of in person appointments. The patient expressed understanding and agreed to proceed.  Patient location: home Provider locations: office  Subjective:    CC: Viral symptoms  HPI: Janice [name] 50 year old female presenting via MyChart video visit with complaints of fever T-max 102, nausea/vomiting, severe headache, rhinorrhea, and diarrhea.  Diarrhea only lasted 1 to 2 days but otherwise her symptoms started approximately 3 days ago.  She does have a nonproductive cough that is intermittent.  She has been taking Tylenol and Motrin with minimal relief.  She does have Phenergan on hand which is helping her nausea.  Was Covid tested on Wednesday with a rapid test and a negative result.  Has been out of work since 2/15 and is requesting a work note.  Past medical history, Surgical history, Family history not pertinant except as noted below, Social history, Allergies, and medications have been entered into the medical record, reviewed, and corrections made.   Review of Systems: See HPI for pertinent positives and negatives.   Objective:    General: Speaking clearly in complete sentences without any shortness of breath.  Alert and oriented x3.  Normal judgment. No apparent acute distress.  Impression and Recommendations:    1. Viral URI Recommend retesting for Covid.  Discussed over-the-counter symptom management options.  Recommend clear liquids and advance diet as tolerated.  Okay to use Phenergan as needed.  If Phenergan is not managing nausea and vomiting well, okay to send in Zofran.  Recommend Delsym for cough management.  Atrovent nasal spray twice daily for rhinorrhea.  Work note  printed/signed and also sent to patient via Pharmacist, community.  I discussed the assessment and treatment plan with the patient. The patient was provided an opportunity to ask questions and all were answered. The patient agreed with the plan and demonstrated an understanding of the instructions.   The patient was advised to call back or seek an in-person evaluation if the symptoms worsen or if the condition fails to improve as anticipated.  20 minutes of non-face-to-face time was provided during this encounter.  Return if symptoms worsen or fail to improve.  Clearnce Sorrel, DNP, APRN, FNP-BC [facility]

## 2020-12-02 ENCOUNTER — Other Ambulatory Visit: Payer: Self-pay | Admitting: Physician Assistant

## 2020-12-02 NOTE — Telephone Encounter (Signed)
Not written by our office, from urgent care. Please advise.

## 2020-12-04 ENCOUNTER — Telehealth: Payer: 59 | Admitting: Family Medicine

## 2020-12-04 ENCOUNTER — Encounter: Payer: Self-pay | Admitting: Family Medicine

## 2020-12-04 ENCOUNTER — Telehealth (INDEPENDENT_AMBULATORY_CARE_PROVIDER_SITE_OTHER): Payer: 59 | Admitting: Family Medicine

## 2020-12-04 DIAGNOSIS — J069 Acute upper respiratory infection, unspecified: Secondary | ICD-10-CM | POA: Diagnosis not present

## 2020-12-04 DIAGNOSIS — R112 Nausea with vomiting, unspecified: Secondary | ICD-10-CM | POA: Diagnosis not present

## 2020-12-04 DIAGNOSIS — R059 Cough, unspecified: Secondary | ICD-10-CM | POA: Diagnosis not present

## 2020-12-04 DIAGNOSIS — R0602 Shortness of breath: Secondary | ICD-10-CM

## 2020-12-04 MED ORDER — FLOVENT HFA 44 MCG/ACT IN AERO: 2.0000 | INHALATION_SPRAY | Freq: Two times a day (BID) | RESPIRATORY_TRACT | 2 refills | Status: DC

## 2020-12-04 MED ORDER — AMOXICILLIN-POT CLAVULANATE 875-125 MG PO TABS: 1.0000 | ORAL_TABLET | Freq: Two times a day (BID) | ORAL | 0 refills | Status: AC

## 2020-12-04 MED ORDER — ONDANSETRON 8 MG PO TBDP
8.0000 mg | ORAL_TABLET | Freq: Three times a day (TID) | ORAL | 0 refills | Status: DC | PRN
Start: 2020-12-04 — End: 2021-11-13

## 2020-12-04 NOTE — Progress Notes (Signed)
Virtual Video Visit via MyChart Note  I connected with  Janice [name] on 12/04/20 at  1:00 PM EST by the video enabled telemedicine application for MyChart, and verified that I am speaking with the correct person using two identifiers.   I introduced myself as a Designer, jewellery with the practice. We discussed the limitations of evaluation and management by telemedicine and the availability of in person appointments. The patient expressed understanding and agreed to proceed.  Participating parties in this visit include: The patient and the nurse practitioner listed. The patient is: At home I am: In the office - [facility]  Subjective:    CC:  Chief Complaint  Patient presents with  . Cough    HPI: Janice [name] is a 50 y.o. year old female presenting today via MyChart today for cough, headaches, shortness of breath, nausea.  Patient reports symptoms originally started on Tuesday 11/25/20 and she was diagnosed with URI during a video visit on 2/18 with Samuel Bouche, NP. Patient had a negative COVID PCR last week. Patient reports after starting supportive therapy she did notice some improvement in her symptoms for a few days, but now reports symptoms worsening for the past 2 days. She is continuing to have headaches, which she believes are related to the increased coughing. Cough is described as productive with yellowish sputum; cough occurs about every 30 minutes per patient. She is noting some chest discomfort related to the coughing, possible costochondritis. She is still vomiting occasionally if she tries to eat a heavy meal or has a big coughing spell. She has been taking her phenergan occasionally, but is trying not to use it too much because it makes her sleepy. She denies abdominal pain, rashes, but does report chest tightness after coughing, dyspnea on exertion, and fevers 101 starting back yesterday. She denies sinus pain/pressure, ear pain, sore throat. She has been  using nasal spray and Vicks Vaporub with minimal improvement.    Past medical history, Surgical history, Family history not pertinant except as noted below, Social history, Allergies, and medications have been entered into the medical record, reviewed, and corrections made.   Review of Systems:  All review of systems negative except what is listed in the HPI   Objective:    General:  Speaking clearly in complete sentences. Absent shortness of breath noted.   Alert and oriented x3.   Normal judgment.  Absent acute distress.   Impression and Recommendations:    1. Viral upper respiratory tract infection 2. Cough 3. Shortness of breath Based on symptoms, it does seem that patient had a viral infection, however given the double sickening, duration of symptoms, and return of fevers, I am concerned she may be developing a secondary infection like pneumonia/bronchitis. Tomorrow will be day 10 of symptoms so I am recommending she continue conservative therapies including Vicks Vaporub, Sudafed, Delsym, Mucinex, Flonase, humidifier use, etc, but am also sending in a prescription for Augmentin to cover for secondary infection. Would also like her to try a Flovent inhaler for shortness of breath and dyspnea. If symptoms do not start improving or worsen, I think she needs to be seen in-person to assess lung sounds and consider chest x-ray.   4. Non-intractable vomiting with nausea, unspecified vomiting type Vomiting seems to be improving and it sounds like she may have started back eating a regular diet (hamburger) a little too soon. Will send in some Zofran so she can try this, but also encourage her to go  back to a more bland diet and advance very gradually. Encouraged to stay hydrated and try to rest.   - fluticasone (FLOVENT HFA) 44 MCG/ACT inhaler; Inhale 2 puffs into the lungs in the morning and at bedtime.  Dispense: 1 each; Refill: 2 - amoxicillin-clavulanate (AUGMENTIN) 875-125 MG tablet;  Take 1 tablet by mouth 2 (two) times daily for 5 days.  Dispense: 10 tablet; Refill: 0 - ondansetron (ZOFRAN-ODT) 8 MG disintegrating tablet; Take 1 tablet (8 mg total) by mouth every 8 (eight) hours as needed for nausea.  Dispense: 20 tablet; Refill: 0  Educated on signs/symptoms requiring urgent evaluation.  Follow-up if symptoms worsen or fail to improve.   I discussed the assessment and treatment plan with the patient. The patient was provided an opportunity to ask questions and all were answered. The patient agreed with the plan and demonstrated an understanding of the instructions.   The patient was advised to call back or seek an in-person evaluation if the symptoms worsen or if the condition fails to improve as anticipated.  I provided 20 minutes of non-face-to-face interaction with this [facility] visit including intake, same-day documentation, and chart review.   Terrilyn Saver, NP

## 2021-02-05 ENCOUNTER — Encounter: Payer: Self-pay | Admitting: Family Medicine

## 2021-02-05 ENCOUNTER — Ambulatory Visit (INDEPENDENT_AMBULATORY_CARE_PROVIDER_SITE_OTHER): Payer: Self-pay

## 2021-02-05 ENCOUNTER — Ambulatory Visit (INDEPENDENT_AMBULATORY_CARE_PROVIDER_SITE_OTHER): Payer: Self-pay | Admitting: Family Medicine

## 2021-02-05 ENCOUNTER — Other Ambulatory Visit: Payer: Self-pay

## 2021-02-05 VITALS — BP 133/76 | HR 106 | Ht 60.0 in | Wt 208.0 lb

## 2021-02-05 DIAGNOSIS — M25561 Pain in right knee: Secondary | ICD-10-CM

## 2021-02-05 DIAGNOSIS — S8990XA Unspecified injury of unspecified lower leg, initial encounter: Secondary | ICD-10-CM

## 2021-02-05 NOTE — Progress Notes (Signed)
Established Patient Office Visit  Subjective:  Patient ID: Janice [name], female    DOB: Mar 27, 1971  Age: 50 y.o. MRN: [id number]  CC:  Chief Complaint  Patient presents with  . Knee Pain    HPI Janice [name] presents for knee pain.  Says started acutely after stepping down out of her dad's truck. It started swelling anb bruising quickly.  it felt like a "sting".  Pt reports swelling and bruising.  She says the bruising was more medial.  And that is where most of her pain and tenderness is.  The bruising has gone away but she continues to have pain 7/10 and burning. She has been using ice,heat,mobic, and aleve.   She states that she is unable to fully bend her knee due to the pain.   Is only able to bend to about 90 degrees.  She actually tried to go to work yesterday and she is a Nutritional therapist and just could not stand on her leg very long and had to get off of it.  Past Medical History:  Diagnosis Date  . Essential hypertension 12/27/2018  . Hyperlipidemia     Past Surgical History:  Procedure Laterality Date  . ABDOMINAL HYSTERECTOMY    . APPENDECTOMY    . CHOLECYSTECTOMY      Family History  Problem Relation Age of Onset  . Cancer Mother   . Heart attack Mother   . Hyperlipidemia Mother   . Hypertension Mother   . Stroke Mother   . Cancer Maternal Grandmother   . Hyperlipidemia Father   . Hypertension Father   . Healthy Sister   . Healthy Brother     Social History   Socioeconomic History  . Marital status: Married    Spouse name: Not on file  . Number of children: Not on file  . Years of education: Not on file  . Highest education level: Not on file  Occupational History  . Not on file  Tobacco Use  . Smoking status: Never Smoker  . Smokeless tobacco: Never Used  Substance and Sexual Activity  . Alcohol use: No  . Drug use: No  . Sexual activity: Yes  Other Topics Concern  . Not on file  Social History Narrative  . Not on file   Social  Determinants of Health   Financial Resource Strain: Not on file  Food Insecurity: Not on file  Transportation Needs: Not on file  Physical Activity: Not on file  Stress: Not on file  Social Connections: Not on file  Intimate Partner Violence: Not on file    Outpatient Medications Prior to Visit  Medication Sig Dispense Refill  . ALPRAZolam (XANAX) 0.5 MG tablet TAKE ONE TABLET BY MOUTH TWICE A DAY AS NEEDED FOR ANXIETY . 60 tablet 5  . atorvastatin (LIPITOR) 40 MG tablet Take 1 tablet (40 mg total) by mouth daily. 90 tablet 3  . buPROPion (WELLBUTRIN XL) 300 MG 24 hr tablet Take 1 tablet (300 mg total) by mouth daily. 90 tablet 1  . cyclobenzaprine (FLEXERIL) 10 MG tablet TAKE ONE TABLET BY MOUTH THREE TIMES A DAY AS NEEDED FOR MUSCLE SPASMS 90 tablet 5  . fluticasone (FLOVENT HFA) 44 MCG/ACT inhaler Inhale 2 puffs into the lungs in the morning and at bedtime. 1 each 2  . ipratropium (ATROVENT) 0.03 % nasal spray Place 2 sprays into both nostrils every 12 (twelve) hours. 30 mL 0  . lisinopril (ZESTRIL) 20 MG tablet Take 1 tablet (20 mg  total) by mouth daily. 90 tablet 1  . meloxicam (MOBIC) 7.5 MG tablet TAKE 1-2 TABLETS BY MOUTH DAILY 90 tablet 0  . ondansetron (ZOFRAN-ODT) 8 MG disintegrating tablet Take 1 tablet (8 mg total) by mouth every 8 (eight) hours as needed for nausea. 20 tablet 0  . pantoprazole (PROTONIX) 40 MG tablet Take 1 tablet (40 mg total) by mouth daily. 90 tablet 3  . promethazine (PHENERGAN) 25 MG tablet Take 1 tablet (25 mg total) by mouth every 6 (six) hours as needed for nausea or vomiting. 30 tablet 2  . zolpidem (AMBIEN) 10 MG tablet Take 1 tablet (10 mg total) by mouth at bedtime. 30 tablet 5   No facility-administered medications prior to visit.    No Known Allergies  ROS Review of Systems    Objective:    Physical Exam Vitals reviewed.  Constitutional:      Appearance: She is well-developed.  HENT:     Head: Normocephalic and atraumatic.  Eyes:      Conjunctiva/sclera: Conjunctivae normal.  Cardiovascular:     Rate and Rhythm: Normal rate.  Pulmonary:     Effort: Pulmonary effort is normal.  Musculoskeletal:     Comments: Left knee with significant tenderness medially just above the medial joint line and over the medial joint line.  Nontender over the patellar tendon or laterally.  She has some trace edema.  She is able to fully extend but is only able to flex to about 90 degrees.  No increased laxity with anterior drawer but she does have pain with anterior drawer.  No significant discomfort with valgus stress on the knee.    Skin:    General: Skin is dry.     Coloration: Skin is not pale.  Neurological:     Mental Status: She is alert and oriented to person, place, and time.  Psychiatric:        Behavior: Behavior normal.     BP 133/76   Pulse (!) 106   Ht 5' (1.524 m)   Wt 208 lb (94.3 kg)   SpO2 100%   BMI 40.62 kg/m  Wt Readings from Last 3 Encounters:  02/05/21 208 lb (94.3 kg)  11/24/20 201 lb (91.2 kg)  09/21/20 194 lb (88 kg)     Health Maintenance Due  Topic Date Due  . MAMMOGRAM  Never done    There are no preventive care reminders to display for this patient.  Lab Results  Component Value Date   TSH 1.129 02/26/2014   Lab Results  Component Value Date   WBC 9.1 11/25/2017   HGB 15.0 11/25/2017   HCT 44.0 11/25/2017   MCV 88.0 11/25/2017   PLT 362 11/25/2017   Lab Results  Component Value Date   NA 139 11/25/2017   K 4.7 11/25/2017   CO2 28 11/25/2017   GLUCOSE 93 11/25/2017   BUN 11 11/25/2017   CREATININE 0.78 11/25/2017   BILITOT 0.6 11/25/2017   ALKPHOS 71 07/31/2014   AST 13 11/25/2017   ALT 13 11/25/2017   PROT 6.9 11/25/2017   ALBUMIN 4.3 07/31/2014   CALCIUM 10.0 11/25/2017   Lab Results  Component Value Date   CHOL 261 (H) 11/25/2017   Lab Results  Component Value Date   HDL 48 (L) 11/25/2017   Lab Results  Component Value Date   LDLCALC 172 (H) 11/25/2017    Lab Results  Component Value Date   TRIG 241 (H) 11/25/2017   Lab Results  Component Value Date   CHOLHDL 5.4 (H) 11/25/2017   No results found for: HGBA1C    Assessment & Plan:   Problem List Items Addressed This Visit   None   Visit Diagnoses    Acute pain of right knee    -  Primary   Relevant Orders   DG Knee Complete 4 Views Right   MR Knee Right Wo Contrast   Traumatic injury of knee       Relevant Orders   MR Knee Right Wo Contrast     Acute right knee pain-we will start with x-rays since her pain is quite significant and she stepped down straight onto that leg just to rule out fracture.  There was no twisting injury to suggest an ACL tear.  Suspect meniscal tear/injury.  She has where tried wearing an Ace bandage but says it actually just feels more compressive.  Continue with icing, anti-inflammatory.   X-rays show no fracture per my review still awaiting radiology formal review.  I again suspect meniscal tear some can put her in a knee brace for now just for comfort recommend continued ice she does not have to do the heat at this point.  Continue anti-inflammatory can add Tylenol to that if needed for pain relief I will write her out of work through Monday she can give [place] call Monday and see how she is doing we will go ahead and move forward with MRI for further work-up as well  No orders of the defined types were placed in this encounter.   Follow-up: No follow-ups on file.    Beatrice Lecher, MD

## 2021-02-05 NOTE — Progress Notes (Signed)
R knee pain x 5 days. She was stepping out of her dad's truck and said that when she did that it felt like a "sting".  Pt reports swelling and bruising. The bruising has gone away but she continues to have pain 7/10 and burning. She has been using ice,heat,mobic, and aleve.   She states that she is unable to fully bend her knee due to the pain.

## 2021-02-10 ENCOUNTER — Other Ambulatory Visit: Payer: Self-pay | Admitting: Family Medicine

## 2021-02-10 DIAGNOSIS — S8990XA Unspecified injury of unspecified lower leg, initial encounter: Secondary | ICD-10-CM

## 2021-02-10 DIAGNOSIS — M25561 Pain in right knee: Secondary | ICD-10-CM

## 2021-02-23 ENCOUNTER — Telehealth (INDEPENDENT_AMBULATORY_CARE_PROVIDER_SITE_OTHER): Payer: Self-pay | Admitting: Physician Assistant

## 2021-02-23 ENCOUNTER — Encounter: Payer: Self-pay | Admitting: Physician Assistant

## 2021-02-23 DIAGNOSIS — J309 Allergic rhinitis, unspecified: Secondary | ICD-10-CM

## 2021-02-23 DIAGNOSIS — G43009 Migraine without aura, not intractable, without status migrainosus: Secondary | ICD-10-CM

## 2021-02-23 DIAGNOSIS — J302 Other seasonal allergic rhinitis: Secondary | ICD-10-CM

## 2021-02-23 MED ORDER — METHYLPREDNISOLONE 4 MG PO TBPK: ORAL_TABLET | ORAL | 0 refills | Status: DC

## 2021-02-23 MED ORDER — FLUTICASONE PROPIONATE 50 MCG/ACT NA SUSP: 2.0000 | Freq: Every day | NASAL | 0 refills | Status: DC

## 2021-02-23 NOTE — Progress Notes (Signed)
Patient ID: Janice [name], female   DOB: 1971-06-30, 50 y.o.   MRN: [id number] .Marland KitchenVirtual Visit via Video Note  I connected with Janice [name] on 02/23/21 at  2:40 PM EDT by a video enabled telemedicine application and verified that I am speaking with the correct person using two identifiers.  Location: Patient: home Provider: clinic  .Marland KitchenParticipating in visit:  Patient: Janice [name] Provider: Iran Planas PA-C   I discussed the limitations of evaluation and management by telemedicine and the availability of in person appointments. The patient expressed understanding and agreed to proceed.  History of Present Illness: Patient is a 50 year old female with hypertension, seasonal allergies, migraines who calls into the clinic with sinus pressure, cough, headache for the past 2 days.  It worsened today.  She has no sick contacts.  She has no known COVID exposure.  She is fully vaccinated.  Patient denies any shortness of breath but does have a dry cough. She is using mucinex with little relief.  Patient feels very congested in her sinuses.  She was not able to go into work today.  She denies any fever, chills, body aches, GI symptoms, loss of smell or taste.  .. Active Ambulatory Problems    Diagnosis Date Noted  . MIGRAINE HEADACHE 11/03/2010  . Insomnia 02/26/2014  . Hypertriglyceridemia, essential 02/27/2014  . Atherosclerosis of coronary artery 08/02/2014  . Fatty liver disease, nonalcoholic 76/73/4193  . Adenoma of right adrenal gland 08/02/2014  . H. pylori infection 08/05/2014  . Abnormal stress test 10/30/2014  . Adjustment disorder with mixed anxiety and depressed mood 03/12/2016  . Emotional crisis as acute reaction to exceptional (gross) stress 03/12/2016  . Class 2 obesity due to excess calories without serious comorbidity with body mass index (BMI) of 37.0 to 37.9 in adult 05/12/2016  . Elevated blood pressure reading 05/12/2016  . GAD (generalized anxiety disorder) 11/18/2016   . Right lateral epicondylitis 12/31/2016  . Dry heaves 11/25/2017  . LUQ abdominal pain 11/25/2017  . Mixed hyperlipidemia 05/11/2018  . 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 Empty sella ([place]) 06/07/2018  . Essential hypertension 12/27/2018  . Chronic bilateral low back pain without sciatica 11/26/2019  . Gastroesophageal reflux disease 11/26/2019  . Nausea 11/28/2020  . Migraine without aura and without status migrainosus, not intractable 02/24/2021  . Seasonal allergies 02/24/2021   Resolved Ambulatory Problems    Diagnosis Date Noted  . Acute stress reaction 03/12/2016  . Muscle spasm of back 03/30/2016   Past Medical History:  Diagnosis Date  . Hyperlipidemia        Observations/Objective: No acute distress Normal mood and appearance No labored breathing  .Marland KitchenThere were no vitals filed for this visit. There is no height or weight on file to calculate BMI.     Assessment and Plan: Marland KitchenMarland KitchenAshleigh was seen today for migraine.  Diagnoses and all orders for this visit:  Allergic sinusitis -     fluticasone (FLONASE) 50 MCG/ACT nasal spray; Place 2 sprays into both nostrils daily. -     methylPREDNISolone (MEDROL DOSEPAK) 4 MG TBPK tablet; Take as directed by package insert.  Migraine without aura and without status migrainosus, not intractable  Seasonal allergies   No signs of bacterial infection. Symptoms lasted 2 days. Seem to be inducing a migraine.  Rapid covid home test negative and fully vaccinated.  Sent medrol dose pack and flonase.  Rest and hydrate. Note for work for today.  Call with any new or worsening symptoms.   Follow  Up Instructions:    I discussed the assessment and treatment plan with the patient. The patient was provided an opportunity to ask questions and all were answered. The patient agreed with the plan and demonstrated an understanding of the instructions.   The patient was advised to call back or seek an in-person evaluation if the symptoms  worsen or if the condition fails to improve as anticipated.   Iran Planas, PA-C

## 2021-02-24 ENCOUNTER — Telehealth: Payer: Self-pay | Admitting: Neurology

## 2021-02-24 DIAGNOSIS — J302 Other seasonal allergic rhinitis: Secondary | ICD-10-CM | POA: Insufficient documentation

## 2021-02-24 DIAGNOSIS — G43009 Migraine without aura, not intractable, without status migrainosus: Secondary | ICD-10-CM | POA: Insufficient documentation

## 2021-02-24 NOTE — Telephone Encounter (Signed)
ok 

## 2021-02-24 NOTE — Telephone Encounter (Signed)
Letter written and given to the front desk to email to patient.  ?

## 2021-02-24 NOTE — Telephone Encounter (Signed)
Patient seen yesterday for Migraine and Sinusitis. She was not able to go to work today and is asking for a note. Okay to write?

## 2021-03-12 ENCOUNTER — Telehealth: Payer: Self-pay | Admitting: Neurology

## 2021-03-12 NOTE — Telephone Encounter (Signed)
Patient left vm asking for note for work for yesterday and today.  She was seen 02/23/2021 for headache and had another bad headache the last two days.  Please advise.

## 2021-03-13 ENCOUNTER — Encounter: Payer: Self-pay | Admitting: *Deleted

## 2021-03-13 MED ORDER — RIZATRIPTAN BENZOATE 10 MG PO TABS: 10.0000 mg | ORAL_TABLET | ORAL | 0 refills | Status: DC | PRN

## 2021-03-13 NOTE — Telephone Encounter (Signed)
Patient made aware.

## 2021-03-13 NOTE — Telephone Encounter (Signed)
Tried to call patient to discuss, no answer, no vm.

## 2021-03-13 NOTE — Telephone Encounter (Signed)
To write out of work we are supposed to have office visits to document. You are not really on any rescue or preventative for headaches and if they are happening more frequently you should be. What are you taking for rescue? Do you have headache today? She does have hx of migraines. So we can give her note but future work note for migraine we need to discuss a plan to help you.

## 2021-03-13 NOTE — Telephone Encounter (Signed)
Sent maxalt for rescue ok to repeat in 2 hours.

## 2021-03-13 NOTE — Telephone Encounter (Signed)
Spoke with pt and she is not on a preventative and does not have any rescue medication.  Advised her that the note is written this time but that she will need an appointment for migraine treatment for any work notes going forward.  She verbalized understanding and will schedule an appointment some time next week.

## 2021-03-25 ENCOUNTER — Ambulatory Visit (INDEPENDENT_AMBULATORY_CARE_PROVIDER_SITE_OTHER): Payer: Self-pay | Admitting: Physician Assistant

## 2021-03-25 DIAGNOSIS — Z5329 Procedure and treatment not carried out because of patient's decision for other reasons: Secondary | ICD-10-CM

## 2021-03-25 NOTE — Progress Notes (Signed)
No show

## 2021-04-30 ENCOUNTER — Other Ambulatory Visit: Payer: Self-pay | Admitting: Physician Assistant

## 2021-04-30 DIAGNOSIS — G8929 Other chronic pain: Secondary | ICD-10-CM

## 2021-05-14 ENCOUNTER — Other Ambulatory Visit: Payer: Self-pay | Admitting: Physician Assistant

## 2021-05-14 DIAGNOSIS — F5101 Primary insomnia: Secondary | ICD-10-CM

## 2021-05-18 ENCOUNTER — Ambulatory Visit (INDEPENDENT_AMBULATORY_CARE_PROVIDER_SITE_OTHER): Payer: Self-pay | Admitting: Physician Assistant

## 2021-05-18 DIAGNOSIS — Z5329 Procedure and treatment not carried out because of patient's decision for other reasons: Secondary | ICD-10-CM

## 2021-05-18 NOTE — Progress Notes (Signed)
No show

## 2021-06-03 ENCOUNTER — Ambulatory Visit: Payer: Self-pay | Admitting: Physician Assistant

## 2021-06-05 ENCOUNTER — Other Ambulatory Visit: Payer: Self-pay | Admitting: Physician Assistant

## 2021-06-05 ENCOUNTER — Ambulatory Visit (INDEPENDENT_AMBULATORY_CARE_PROVIDER_SITE_OTHER): Payer: Self-pay | Admitting: Physician Assistant

## 2021-06-05 ENCOUNTER — Other Ambulatory Visit: Payer: Self-pay

## 2021-06-05 ENCOUNTER — Encounter: Payer: Self-pay | Admitting: Physician Assistant

## 2021-06-05 VITALS — BP 179/103 | HR 92 | Ht 60.0 in | Wt 195.0 lb

## 2021-06-05 DIAGNOSIS — Z1329 Encounter for screening for other suspected endocrine disorder: Secondary | ICD-10-CM

## 2021-06-05 DIAGNOSIS — E782 Mixed hyperlipidemia: Secondary | ICD-10-CM

## 2021-06-05 DIAGNOSIS — I2510 Atherosclerotic heart disease of native coronary artery without angina pectoris: Secondary | ICD-10-CM

## 2021-06-05 DIAGNOSIS — F4321 Adjustment disorder with depressed mood: Secondary | ICD-10-CM

## 2021-06-05 DIAGNOSIS — Z79899 Other long term (current) drug therapy: Secondary | ICD-10-CM

## 2021-06-05 DIAGNOSIS — M65311 Trigger thumb, right thumb: Secondary | ICD-10-CM

## 2021-06-05 DIAGNOSIS — M545 Low back pain, unspecified: Secondary | ICD-10-CM

## 2021-06-05 DIAGNOSIS — F411 Generalized anxiety disorder: Secondary | ICD-10-CM

## 2021-06-05 DIAGNOSIS — F5101 Primary insomnia: Secondary | ICD-10-CM

## 2021-06-05 DIAGNOSIS — I10 Essential (primary) hypertension: Secondary | ICD-10-CM

## 2021-06-05 MED ORDER — ALPRAZOLAM 0.5 MG PO TABS: ORAL_TABLET | ORAL | 0 refills | Status: DC

## 2021-06-05 MED ORDER — MELOXICAM 7.5 MG PO TABS: 7.5000 mg | ORAL_TABLET | Freq: Every day | ORAL | 1 refills | Status: DC

## 2021-06-05 MED ORDER — LISINOPRIL 20 MG PO TABS: 20.0000 mg | ORAL_TABLET | Freq: Every day | ORAL | 1 refills | Status: DC

## 2021-06-05 MED ORDER — ESCITALOPRAM OXALATE 10 MG PO TABS: 10.0000 mg | ORAL_TABLET | Freq: Every day | ORAL | 0 refills | Status: DC

## 2021-06-05 MED ORDER — ZOLPIDEM TARTRATE 10 MG PO TABS: ORAL_TABLET | ORAL | 5 refills | Status: DC

## 2021-06-05 MED ORDER — ATORVASTATIN CALCIUM 40 MG PO TABS: 40.0000 mg | ORAL_TABLET | Freq: Every day | ORAL | 3 refills | Status: DC

## 2021-06-05 MED ORDER — BUPROPION HCL ER (XL) 300 MG PO TB24: 300.0000 mg | ORAL_TABLET | Freq: Every day | ORAL | 1 refills | Status: DC

## 2021-06-08 ENCOUNTER — Encounter: Payer: Self-pay | Admitting: Physician Assistant

## 2021-06-08 ENCOUNTER — Telehealth: Payer: Self-pay | Admitting: Neurology

## 2021-06-08 DIAGNOSIS — F4321 Adjustment disorder with depressed mood: Secondary | ICD-10-CM | POA: Insufficient documentation

## 2021-06-08 NOTE — Progress Notes (Signed)
Subjective:    Patient ID: Janice [name], female    DOB: Dec 08, 1970, 50 y.o.   MRN: [id number]  HPI Patient is a 50 year old obese female with hypertension, migraines, atherosclerosis, depression, anxiety, insomnia who presents to the clinic for medication refill.  She admits she has been out of her medication.  Her mother died 2 weeks ago.  She is devastated.  She was with her when she passed.  She is back to work but she is not functioning well.  She does have trouble sleeping and eating.  She denies any suicidal thoughts or homicidal idealizations.  Patient denies any chest pain, palpitations, headaches, vision changes.   .. Active Ambulatory Problems    Diagnosis Date Noted   MIGRAINE HEADACHE 11/03/2010   Insomnia 02/26/2014   Hypertriglyceridemia, essential 02/27/2014   Atherosclerosis of coronary artery 08/02/2014   Fatty liver disease, nonalcoholic [id number]   Adenoma of right adrenal gland 08/02/2014   H. 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     Objective:   Physical Exam Vitals reviewed.  Constitutional:      Appearance: Normal appearance. She is obese.  HENT:     Head: Normocephalic.  Neck:     Vascular: No carotid bruit.  Cardiovascular:     Rate and Rhythm: Normal rate and regular rhythm.     Pulses: Normal pulses.     Heart sounds: Normal heart sounds.  Pulmonary:     Effort: Pulmonary effort is normal.     Breath sounds: Normal breath sounds.  Lymphadenopathy:     Cervical: No cervical adenopathy.  Neurological:     General: No focal deficit present.     Mental Status: She is alert and oriented to person, place, and time.  Psychiatric:     Comments: Tearful.   .. Depression screen [facility] 2/9 06/05/2021 11/24/2020 11/23/2019 04/06/2019 11/23/2017  Decreased Interest '3 1 1 1 1  '$ Down, Depressed, Hopeless '3 1 1 '$ 0 1  PHQ - 2 Score '6 2 2 1 2  '$ Altered sleeping - '3 3 3 3  '$ Tired, decreased energy - '3 2 3 2  '$ Change in appetite - '2 3 3 1  '$ Feeling bad or failure about yourself  - 0 0 1 0  Trouble concentrating - 0 0 0 0  Moving slowly or fidgety/restless - 0 0  0 0  Suicidal thoughts - 0 0 0 0  PHQ-9 Score - '10 10 11 8  '$ Difficult doing work/chores - - - Somewhat difficult -   .. GAD 7 : Generalized Anxiety Score 11/24/2020 11/23/2019 04/06/2019 05/11/2018  Nervous, Anxious, on Edge '2 1 3 '$ 0  Control/stop worrying 1 1 0 0  Worry too much - different things '1 1 3 '$ 0  Trouble relaxing '1 1 3 '$ 0  Restless 1 0 1 0  Easily annoyed or irritable '1 1 3 '$ 0  Afraid - awful might happen 0 0 1 0  Total GAD 7 Score '7 5 14 '$ 0  Anxiety Difficulty - - Not difficult at all Not difficult at all            Assessment & Plan:  Marland KitchenMarland KitchenLaterrica was seen today for medication refill.  Diagnoses and all orders for this  visit:  Grief -     escitalopram (LEXAPRO) 10 MG tablet; Take 1 tablet (10 mg total) by mouth daily.  Essential hypertension -     COMPLETE METABOLIC PANEL WITH GFR -     lisinopril (ZESTRIL) 20 MG tablet; Take 1 tablet (20 mg total) by mouth daily.  Mixed hyperlipidemia -     Lipid Panel w/reflex Direct LDL  Thyroid disorder screen -     TSH  Medication management -     TSH -     Lipid Panel w/reflex Direct LDL -     COMPLETE METABOLIC PANEL WITH GFR -     CBC with Differential/Platelet  GAD (generalized anxiety disorder) -     ALPRAZolam (XANAX) 0.5 MG tablet; Take one-two tablets as needed up to twice a day for anxiety. -     buPROPion (WELLBUTRIN XL) 300 MG 24 hr tablet; Take 1 tablet (300 mg total) by mouth daily.  Atherosclerosis of native coronary artery of native heart without angina pectoris -     atorvastatin (LIPITOR) 40 MG tablet; Take 1 tablet (40 mg total) by mouth daily.  Primary insomnia -     zolpidem (AMBIEN) 10 MG tablet; TAKE ONE TABLET BY MOUTH ONE TIME DAILY AT BEDTIME  Trigger finger of right thumb -     meloxicam (MOBIC) 7.5 MG tablet; Take 1-2 tablets (7.5-15 mg total) by mouth daily.   Declined PHQ/GAD today stating "yes" to everything.  Pt is in a lot of grief 2 weeks after mother died.  On wellbutrin.  Added lexapro.  Pt is going to consider grief counseling.  Increased at patients request xanax for the next month. Discussed dependence risk and to use sparingly. Pt confirms understanding.   Not had medications. Sent refills.   BP not controlled due to not having medications.  Recheck in 4 weeks. Monitor BP at home.   Needs labs. Come fasting.   Spent 30 minutes with patient discussing stages of grief and importance of self care. Strongly recommended counseling.

## 2021-06-08 NOTE — Telephone Encounter (Signed)
Probably due to grief? Wanting a note. Ok to give. We saw her on Friday. Write for Friday and Saturday.

## 2021-06-08 NOTE — Telephone Encounter (Signed)
Patient left a vm asking for a note for Saturday. She didn't give any other details.   Janice [name] - do you know what she is referring to?

## 2021-06-08 NOTE — Telephone Encounter (Signed)
Called and confirmed with patient. She will print from [place]. Letter written.

## 2021-06-11 NOTE — Telephone Encounter (Signed)
Patient left another vm. She is having a really hard time with her mother's death. She has not been able to return to work this week. Okay for a note for the week?

## 2021-06-11 NOTE — Telephone Encounter (Signed)
Ok for a note for the week due to grief reaction.

## 2021-06-12 NOTE — Telephone Encounter (Signed)
Letter written, patient made aware.

## 2021-06-19 NOTE — Telephone Encounter (Signed)
That is fine, write a letter for 1 month but giving her the option to go back sooner if she feels ready and that should reduce the phone calls, she should really consider grief counseling, I am happy to set this up if she would like, does she desire any other medication adjustments?  She also needs to understand that these serial letters out of work will not necessarily keep her from getting fired.

## 2021-06-19 NOTE — Telephone Encounter (Signed)
Patient left a vm asking for another note for this week. She has still been unable to return to work due to grief over death of her mother. Please advise if okay to write.

## 2021-06-19 NOTE — Telephone Encounter (Signed)
Letter written. Mychart message sent to patient.

## 2021-07-02 ENCOUNTER — Other Ambulatory Visit: Payer: Self-pay | Admitting: Physician Assistant

## 2021-07-02 DIAGNOSIS — F411 Generalized anxiety disorder: Secondary | ICD-10-CM

## 2021-07-02 NOTE — Telephone Encounter (Signed)
Last appt 06/05/2021 Last written 06/05/2021 #90 no refills - the take 1-2 tablets twice daily as needed

## 2021-07-06 ENCOUNTER — Ambulatory Visit: Payer: Self-pay | Admitting: Physician Assistant

## 2021-07-07 ENCOUNTER — Ambulatory Visit: Payer: Self-pay | Admitting: Physician Assistant

## 2021-07-09 ENCOUNTER — Ambulatory Visit (INDEPENDENT_AMBULATORY_CARE_PROVIDER_SITE_OTHER): Payer: Self-pay | Admitting: Physician Assistant

## 2021-07-09 ENCOUNTER — Other Ambulatory Visit: Payer: Self-pay

## 2021-07-09 VITALS — BP 168/95 | HR 102 | Ht 60.0 in | Wt 194.0 lb

## 2021-07-09 DIAGNOSIS — F411 Generalized anxiety disorder: Secondary | ICD-10-CM

## 2021-07-09 DIAGNOSIS — Z23 Encounter for immunization: Secondary | ICD-10-CM

## 2021-07-09 DIAGNOSIS — F5101 Primary insomnia: Secondary | ICD-10-CM

## 2021-07-09 DIAGNOSIS — F4321 Adjustment disorder with depressed mood: Secondary | ICD-10-CM

## 2021-07-09 DIAGNOSIS — G8929 Other chronic pain: Secondary | ICD-10-CM

## 2021-07-09 DIAGNOSIS — M545 Low back pain, unspecified: Secondary | ICD-10-CM

## 2021-07-09 MED ORDER — ESCITALOPRAM OXALATE 10 MG PO TABS: 10.0000 mg | ORAL_TABLET | Freq: Every day | ORAL | 1 refills | Status: DC

## 2021-07-09 MED ORDER — CYCLOBENZAPRINE HCL 10 MG PO TABS: ORAL_TABLET | ORAL | 1 refills | Status: DC

## 2021-07-09 MED ORDER — ZOLPIDEM TARTRATE 10 MG PO TABS: ORAL_TABLET | ORAL | 1 refills | Status: DC

## 2021-07-09 MED ORDER — ALPRAZOLAM 0.5 MG PO TABS: ORAL_TABLET | ORAL | 1 refills | Status: DC

## 2021-07-09 NOTE — Progress Notes (Signed)
Subjective:    Patient ID: Janice [name], female    DOB: 1971/07/02, 50 y.o.   MRN: [id number]  HPI Pt is a 50 yo obese female with HTN, CAD, Migraines, MDD, anxiety and active grief who presents to the clinic to follow up after her mother pasted a way and had to increase her xanax. She is doing much better today. She is back to work for the last 3 weeks. No SI/HC. She is handling death better and able to see hope in it. She has started grief counseling.   Her low back pain flares from time to time and needs refills on flexeril.   .. Active Ambulatory Problems    Diagnosis Date Noted   MIGRAINE HEADACHE 11/03/2010   Insomnia 02/26/2014   Hypertriglyceridemia, essential 02/27/2014   Atherosclerosis of coronary artery 08/02/2014   Fatty liver disease, nonalcoholic 78/58/8502   Adenoma of right adrenal gland 08/02/2014   H. 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     Objective:   Physical Exam Vitals reviewed.  Constitutional:      Appearance: Normal appearance. She is obese.  HENT:     Head: Normocephalic.  Cardiovascular:     Rate and Rhythm: Normal rate and regular rhythm.     Pulses: Normal pulses.     Heart sounds: Normal heart sounds.  Pulmonary:     Effort: Pulmonary effort is normal.     Breath sounds: Normal breath sounds.  Musculoskeletal:     Right lower leg: No edema.     Left lower leg: No edema.  Neurological:     General: No focal deficit present.     Mental Status: She is alert and oriented to person, place, and time.  Psychiatric:        Mood and Affect: Mood normal.      .. Depression screen [facility] 2/9 07/09/2021 06/05/2021 11/24/2020 11/23/2019 04/06/2019  Decreased Interest 1 3 1 1 1   Down, Depressed, Hopeless 1 3 1 1  0  PHQ - 2 Score 2 6 2 2 1   Altered sleeping 3 - 3 3 3   Tired, decreased energy 1 - 3 2 3   Change in appetite 1 - 2 3 3   Feeling bad or failure about yourself  1 - 0 0 1  Trouble concentrating 1 - 0 0 0  Moving slowly or fidgety/restless 0 - 0  0 0  Suicidal thoughts 0 - 0 0 0  PHQ-9 Score 9 - 10 10 11   Difficult doing work/chores Not difficult at all - - - Somewhat difficult   .[name] GAD 7 : Generalized Anxiety Score 07/09/2021 11/24/2020 11/23/2019 04/06/2019  Nervous, Anxious, on Edge 1 2 1 3   Control/stop worrying 0 1 1 0  Worry too much - different things 1 1 1 3   Trouble relaxing 1 1 1 3   Restless 0 1 0 1  Easily annoyed or irritable 0 1 1 3   Afraid - awful might happen 0 0 0 1  Total GAD 7 Score 3 7 5 14   Anxiety Difficulty Not difficult at all - - Not difficult at all        Assessment & Plan:  Marland KitchenMarland KitchenArdra was seen today for follow-up.  Diagnoses and all orders for this visit:  Grief -     escitalopram (LEXAPRO) 10 MG tablet; Take 1 tablet (10 mg total) by mouth daily.  Flu vaccine need -     Flu Vaccine  QUAD 74mo+IM (Fluarix, Fluzone & Alfiuria Quad PF)  Need for shingles vaccine -     Varicella-zoster vaccine IM  Primary insomnia -     zolpidem (AMBIEN) 10 MG tablet; TAKE ONE TABLET BY MOUTH ONE TIME DAILY AT BEDTIME  Chronic bilateral low back pain without sciatica -     cyclobenzaprine (FLEXERIL) 10 MG tablet; TAKE ONE TABLET BY MOUTH THREE TIMES A DAY AS NEEDED FOR MUSCLE SPASM  GAD (generalized anxiety disorder) -     ALPRAZolam (XANAX) 0.5 MG tablet; Take one tablet as needed up to twice a day for anxiety.  Pt is doing much better. She is starting grief counseling. Continue lexapro 10mg  daily. Decreased her xanax back down to bid. Discussed to only use this sparingly if she can. Refilled ambien for sleep. Flexeril for back pain.  Flu shot given today.

## 2021-07-15 ENCOUNTER — Encounter: Payer: Self-pay | Admitting: Physician Assistant

## 2021-11-13 ENCOUNTER — Telehealth (INDEPENDENT_AMBULATORY_CARE_PROVIDER_SITE_OTHER): Payer: Self-pay | Admitting: Physician Assistant

## 2021-11-13 ENCOUNTER — Ambulatory Visit: Payer: Self-pay | Admitting: Physician Assistant

## 2021-11-13 ENCOUNTER — Other Ambulatory Visit: Payer: Self-pay

## 2021-11-13 ENCOUNTER — Encounter: Payer: Self-pay | Admitting: Physician Assistant

## 2021-11-13 VITALS — BP 171/118 | HR 90 | Temp 100.1°F | Ht 65.0 in | Wt 192.0 lb

## 2021-11-13 DIAGNOSIS — R112 Nausea with vomiting, unspecified: Secondary | ICD-10-CM

## 2021-11-13 DIAGNOSIS — R110 Nausea: Secondary | ICD-10-CM

## 2021-11-13 DIAGNOSIS — R030 Elevated blood-pressure reading, without diagnosis of hypertension: Secondary | ICD-10-CM

## 2021-11-13 DIAGNOSIS — R6889 Other general symptoms and signs: Secondary | ICD-10-CM

## 2021-11-13 MED ORDER — ONDANSETRON 8 MG PO TBDP: 8.0000 mg | ORAL_TABLET | Freq: Three times a day (TID) | ORAL | 0 refills | Status: DC | PRN

## 2021-11-13 MED ORDER — PROMETHAZINE HCL 25 MG PO TABS: 25.0000 mg | ORAL_TABLET | Freq: Four times a day (QID) | ORAL | 0 refills | Status: DC | PRN

## 2021-11-13 NOTE — Progress Notes (Signed)
Pt states symptoms  started on Wednesday,  fever body aches, body chills vomiting and nausea, pt tested negative for covid on wednesday, yellow mucus chest congestion and diarrhea and sob, pt has been drinking taking Gatorade and Pedialyte

## 2021-11-13 NOTE — Progress Notes (Signed)
Patient ID: Janice [name], female   DOB: 06-10-1971, 51 y.o.   MRN: [id number] .Marland KitchenVirtual Visit via Video Note  I connected with Janice [name] on 11/13/21 at 10:50 AM EST by a video enabled telemedicine application and verified that I am speaking with the correct person using two identifiers.  Location: Patient: home Provider: clinic  .Marland KitchenParticipating in visit:  Patient: Janice [name] Provider: Iran Planas PA-C   I discussed the limitations of evaluation and management by telemedicine and the availability of in person appointments. The patient expressed understanding and agreed to proceed.  History of Present Illness: Pt is a 51 yo female with fever, body aches chills, vomiting, nausea for last 3 days. Pt tested negative for covid. She has had one J and J. She does have some chest congestion but nausea and vomiting are the worst symptoms. She has been drinking gatorade. Symptoms seem to be improving. No fever right now. Daughter and granddaughter have both been sick with similar symptoms.  .. Active Ambulatory Problems    Diagnosis Date Noted   MIGRAINE HEADACHE 11/03/2010   Insomnia 02/26/2014   Hypertriglyceridemia, essential 02/27/2014   Atherosclerosis of coronary artery 08/02/2014   Fatty liver disease, nonalcoholic 94/17/4081   Adenoma of right adrenal gland 08/02/2014   H. 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   Empty  sella ([place]) 06/07/2018   Essential hypertension 12/27/2018   Chronic bilateral low back pain without sciatica 11/26/2019   Gastroesophageal reflux disease 11/26/2019   Nausea 11/28/2020   Migraine without aura and without status migrainosus, not intractable 02/24/2021   Seasonal allergies 02/24/2021   Grief 06/08/2021   Resolved Ambulatory Problems    Diagnosis Date Noted   Acute stress reaction 03/12/2016   Muscle spasm of back 03/30/2016   Past Medical History:  Diagnosis Date   Hyperlipidemia       Observations/Objective: No acute distress Normal breathing Normal mood and appearance  .[name] Today's Vitals   11/13/21 1029  BP: (!) 171/118  Pulse: 90  Temp: 100.1 F (37.8 C)  Weight: 192 lb (87.1 kg)  Height: 5\' 5"  (1.651 m)   Body mass index is 31.95 kg/m.    Assessment and Plan: Marland KitchenMarland KitchenDaphene was seen today for fever.  Diagnoses and all orders for this visit:  Flu-like symptoms  Nausea -     promethazine (PHENERGAN) 25 MG tablet; Take 1 tablet (25 mg total) by mouth every 6 (six) hours as needed for nausea or vomiting. -     ondansetron (ZOFRAN-ODT) 8 MG disintegrating tablet; Take 1 tablet (8 mg total) by mouth every 8 (eight) hours as needed for nausea.  Non-intractable vomiting with nausea -     promethazine (PHENERGAN) 25 MG tablet; Take 1 tablet (25 mg total) by mouth every 6 (six) hours as needed for nausea or vomiting. -     ondansetron (ZOFRAN-ODT) 8 MG disintegrating tablet; Take 1 tablet (8 mg total) by mouth every 8 (eight) hours as  needed for nausea.  Elevated blood pressure reading   Concerned about BP but she is sick. Continue to check if not coming down come into office next week Discussed CP, palpitations, weakness go to ED. Covid negative ? Flu but out of window and daughter was negative for flu with similar symptoms Discussed symptomatic care Zofran and phenergan refilled as needed one for if needs at work and the other one to use at home if  ok to go to sleep. BRAT diet Written out Wednesday through Friday needs to be fever free for 24 hours before returning back to work.    Follow Up Instructions:    I discussed the assessment and treatment plan with the patient. The patient was provided an opportunity to ask questions and all were answered. The patient agreed with the plan and demonstrated an understanding of the instructions.   The patient was advised to call back or seek an in-person evaluation if the symptoms worsen or if the condition fails to improve as anticipated.   Iran Planas, PA-C

## 2021-11-16 ENCOUNTER — Telehealth: Payer: Self-pay | Admitting: Physician Assistant

## 2021-11-20 ENCOUNTER — Encounter: Payer: Self-pay | Admitting: Physician Assistant

## 2021-11-20 ENCOUNTER — Telehealth (INDEPENDENT_AMBULATORY_CARE_PROVIDER_SITE_OTHER): Payer: Self-pay | Admitting: Physician Assistant

## 2021-11-20 VITALS — BP 132/72 | HR 88 | Temp 98.1°F

## 2021-11-20 DIAGNOSIS — J069 Acute upper respiratory infection, unspecified: Secondary | ICD-10-CM

## 2021-11-20 DIAGNOSIS — A084 Viral intestinal infection, unspecified: Secondary | ICD-10-CM | POA: Insufficient documentation

## 2021-11-20 NOTE — Progress Notes (Signed)
Pt states she still feels unwell pt report fever vomiting has stopped wed, pt says she feels sluggish and not well enough to return to work, pt would like a work note.

## 2021-11-20 NOTE — Progress Notes (Signed)
..  Virtual Visit via Telephone Note  I connected with Janice [name] on 11/20/21 at  8:50 AM EST by telephone and verified that I am speaking with the correct person using two identifiers.  Location: Patient: home Provider: clinic  .Janice KitchenParticipating in visit:  Patient: Janice [name] Provider: Iran Planas PA-C   I discussed the limitations, risks, security and privacy concerns of performing an evaluation and management service by telephone and the availability of in person appointments. I also discussed with the patient that there may be a patient responsible charge related to this service. The patient expressed understanding and agreed to proceed.   History of Present Illness: Pt was seen on 2/3 for viral symptoms with nausea, vomiting, diarrhea, fever. She was not feeling better on Monday but we did not have appts until today. She started feeling better Wednesday. She is getting her strength back. Fever resolved Wednesday as well. No more nausea, vomiting diarrhea. Ready to go back to work.   .. Active Ambulatory Problems    Diagnosis Date Noted   MIGRAINE HEADACHE 11/03/2010   Insomnia 02/26/2014   Hypertriglyceridemia, essential 02/27/2014   Atherosclerosis of coronary artery 08/02/2014   Fatty liver disease, nonalcoholic 20/94/7096   Adenoma of right adrenal gland 08/02/2014   H. 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   Empty  sella ([place]) 06/07/2018   Essential hypertension 12/27/2018   Chronic bilateral low back pain without sciatica 11/26/2019   Gastroesophageal reflux disease 11/26/2019   Nausea 11/28/2020   Migraine without aura and without status migrainosus, not intractable 02/24/2021   Seasonal allergies 02/24/2021   Grief 06/08/2021   Viral gastroenteritis 11/20/2021   Resolved Ambulatory Problems    Diagnosis Date Noted   Acute stress reaction 03/12/2016   Muscle spasm of back 03/30/2016   Past Medical History:  Diagnosis Date   Hyperlipidemia     Observations/Objective: No acute distress No cough or labored breathing  .Janice [name] Today's Vitals   11/20/21 0901  BP: 132/72  Pulse: 88  Temp: 98.1 F (36.7 C)   There is no height or weight on file to calculate BMI.    Assessment and Plan: Janice KitchenMarland KitchenLizzet was seen today for cough.  Diagnoses and all orders for this visit:  Viral gastroenteritis  Viral upper respiratory tract infection   Pt has improved. No fever.  Written to go back to work Monday.  Vitals improved from 2/3.    Follow Up Instructions:    I discussed the assessment and treatment plan with the patient. The patient was provided an opportunity to ask questions and all were answered. The patient agreed with the plan and demonstrated an understanding of the instructions.   The patient was advised to call back or seek an in-person evaluation if the symptoms worsen or if the condition fails to improve as anticipated.  I provided 10 minutes of non-face-to-face time during this encounter.   Iran Planas, PA-C

## 2021-11-24 ENCOUNTER — Telehealth: Payer: Self-pay | Admitting: Neurology

## 2021-11-24 NOTE — Telephone Encounter (Signed)
Ok to write? 

## 2021-11-24 NOTE — Telephone Encounter (Signed)
Written and given to the front desk for them to email to patient.

## 2021-11-24 NOTE — Telephone Encounter (Signed)
Patient called and left a vm stating her note should have said to return today, not yesterday.  Last week note was written for 2/6-2/10  Okay to write?

## 2021-12-16 ENCOUNTER — Encounter: Payer: Self-pay | Admitting: Physician Assistant

## 2021-12-16 ENCOUNTER — Other Ambulatory Visit: Payer: Self-pay

## 2021-12-16 ENCOUNTER — Ambulatory Visit (INDEPENDENT_AMBULATORY_CARE_PROVIDER_SITE_OTHER): Payer: Self-pay | Admitting: Physician Assistant

## 2021-12-16 VITALS — BP 170/95 | HR 93 | Ht 65.0 in | Wt 208.0 lb

## 2021-12-16 DIAGNOSIS — M65311 Trigger thumb, right thumb: Secondary | ICD-10-CM

## 2021-12-16 DIAGNOSIS — F5101 Primary insomnia: Secondary | ICD-10-CM

## 2021-12-16 DIAGNOSIS — F411 Generalized anxiety disorder: Secondary | ICD-10-CM

## 2021-12-16 DIAGNOSIS — M545 Low back pain, unspecified: Secondary | ICD-10-CM

## 2021-12-16 DIAGNOSIS — F4321 Adjustment disorder with depressed mood: Secondary | ICD-10-CM

## 2021-12-16 DIAGNOSIS — J309 Allergic rhinitis, unspecified: Secondary | ICD-10-CM

## 2021-12-16 DIAGNOSIS — I10 Essential (primary) hypertension: Secondary | ICD-10-CM

## 2021-12-16 DIAGNOSIS — G8929 Other chronic pain: Secondary | ICD-10-CM

## 2021-12-16 DIAGNOSIS — K219 Gastro-esophageal reflux disease without esophagitis: Secondary | ICD-10-CM

## 2021-12-16 MED ORDER — METHYLPREDNISOLONE SODIUM SUCC 125 MG IJ SOLR
125.0000 mg | Freq: Once | INTRAMUSCULAR | Status: AC
  Administered 2021-12-16: 125 mg via INTRAMUSCULAR

## 2021-12-16 MED ORDER — BUPROPION HCL ER (XL) 300 MG PO TB24: 300.0000 mg | ORAL_TABLET | Freq: Every day | ORAL | 1 refills | Status: DC

## 2021-12-16 MED ORDER — PANTOPRAZOLE SODIUM 40 MG PO TBEC: 40.0000 mg | DELAYED_RELEASE_TABLET | Freq: Every day | ORAL | 3 refills | Status: DC

## 2021-12-16 MED ORDER — ESCITALOPRAM OXALATE 10 MG PO TABS: 10.0000 mg | ORAL_TABLET | Freq: Every day | ORAL | 1 refills | Status: DC

## 2021-12-16 MED ORDER — ZOLPIDEM TARTRATE 10 MG PO TABS: ORAL_TABLET | ORAL | 1 refills | Status: DC

## 2021-12-16 MED ORDER — MELOXICAM 7.5 MG PO TABS: 7.5000 mg | ORAL_TABLET | Freq: Every day | ORAL | 1 refills | Status: DC

## 2021-12-16 MED ORDER — LISINOPRIL 20 MG PO TABS: 20.0000 mg | ORAL_TABLET | Freq: Every day | ORAL | 1 refills | Status: DC

## 2021-12-16 MED ORDER — CYCLOBENZAPRINE HCL 10 MG PO TABS: ORAL_TABLET | ORAL | 1 refills | Status: DC

## 2021-12-16 MED ORDER — ALPRAZOLAM 0.5 MG PO TABS: ORAL_TABLET | ORAL | 1 refills | Status: DC

## 2021-12-16 NOTE — Progress Notes (Signed)
? ?Subjective:  ? ? Patient ID: Janice [name], female    DOB: [birth year], 51 y.o.   MRN: [id number] ? ?Headache  ?Associated symptoms include sinus pressure (Frontal Sinuses) and vomiting (1 episode this AM). Pertinent negatives include no coughing, fever, nausea or rhinorrhea.  ? ?This is a well appearing 51YO female presenting with CC of "bad headaches." She describes these headaches as a throbbing sensation that are centered around her frontal sinuses. 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 Her symptoms resolve during times away from this job. She has been using '400mg'$  of Ibuprofen q6hr with minimal relief. She experiences greatest relief using her flonase nasal spray. 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 ?She experienced 1 episode of vomiting this morning. ? ?Needs refills on other ongoing medications.  ? ? ?Review of Systems  ?Constitutional: Negative.  Negative for fatigue and fever.  ?HENT:  Positive for congestion (Frontal Sinuses), sinus pressure (Frontal Sinuses) and sinus pain. Negative for rhinorrhea.   ?Eyes:  Positive for itching (Occasional).  ?Respiratory: Negative.  Negative for cough and shortness of breath.   ?Cardiovascular: Negative.  Negative for chest pain.  ?Gastrointestinal:  Positive for vomiting (1 episode this AM). Negative for constipation, diarrhea and nausea.  ?Genitourinary: Negative.   ?Neurological:  Positive for headaches.  ? ?   ?Objective:  ? Physical Exam ?Vitals reviewed.  ?Constitutional:   ?   General: She is not in acute distress. ?   Appearance: She is well-developed.  ?HENT:  ?   Head: Normocephalic and atraumatic.  ?   Nose:  ?   Right Sinus: Frontal sinus tenderness present.  ?   Left Sinus: Frontal sinus tenderness present.  ?Cardiovascular:  ?   Rate and  Rhythm: Normal rate and regular rhythm.  ?   Heart sounds: Normal heart sounds. No murmur heard. ?Pulmonary:  ?   Effort: Pulmonary effort is normal.  ?   Breath sounds: Normal breath sounds. No wheezing.  ?Neurological:  ?   Mental Status: She is alert and oriented to person, place, and time.  ?Psychiatric:     ?   Mood and Affect: Mood normal.     ?   Behavior: Behavior normal.  ? ? ?   ?Assessment & Plan:  ? ?Marland KitchenLevada [name] was seen today for headache. ? ?Diagnoses and all orders for this visit: ? ?Allergic sinusitis ?-     methylPREDNISolone sodium succinate (SOLU-MEDROL) 125 mg/2 mL injection 125 mg ? ?GAD (generalized anxiety disorder) ?-     ALPRAZolam (XANAX) 0.5 MG tablet; Take one tablet as needed up to twice a day for anxiety. ?-     buPROPion (WELLBUTRIN XL) 300 MG 24 hr tablet; Take 1 tablet (300 mg total) by mouth daily. ? ?Chronic bilateral low back pain without sciatica ?-     cyclobenzaprine (FLEXERIL) 10 MG tablet; TAKE ONE TABLET BY MOUTH THREE TIMES A DAY AS NEEDED FOR MUSCLE SPASM ? ?Grief ?-     escitalopram (LEXAPRO) 10 MG tablet; Take 1 tablet (10 mg total) by mouth daily. ? ?Essential hypertension ?-     lisinopril (ZESTRIL) 20 MG tablet; Take 1 tablet (20 mg total) by mouth daily. ? ?Trigger finger of right thumb ?-     meloxicam (MOBIC) 7.5 MG  tablet; Take 1-2 tablets (7.5-15 mg total) by mouth daily. ? ?Gastroesophageal reflux disease, unspecified whether esophagitis present ?-     pantoprazole (PROTONIX) 40 MG tablet; Take 1 tablet (40 mg total) by mouth daily. ? ?Primary insomnia ?-     zolpidem (AMBIEN) 10 MG tablet; TAKE ONE TABLET BY MOUTH ONE TIME DAILY AT BEDTIME ? ? ?Work note provided for the remainder of this week with hope that patient starts new position this coming week. ?BP not to goal and increased Lisinopril dose to '20mg'$  daily. Start checking at home and report readings. 2 weeks BP in office.  ?Consider starting an OTC allergy mediation such as Allegra to help with symptoms -  avoid allergy medications containing decongestants given elevated blood pressure. ?Continue daily use of flonase. ?Follow up as normally scheduled unless new or worsening symptoms arise, should notice improvement following Solumedrol injection given today in office for allergic sinusitis.  ?BP nurse visit 2 week follow up.  ?

## 2021-12-21 DIAGNOSIS — I10 Essential (primary) hypertension: Secondary | ICD-10-CM | POA: Insufficient documentation

## 2022-03-04 ENCOUNTER — Other Ambulatory Visit: Payer: Self-pay | Admitting: Physician Assistant

## 2022-03-04 DIAGNOSIS — M545 Low back pain, unspecified: Secondary | ICD-10-CM

## 2022-05-20 ENCOUNTER — Telehealth: Payer: Self-pay | Admitting: Neurology

## 2022-05-20 NOTE — Telephone Encounter (Signed)
Patient left vm stating her blood pressures are very high. She states has been running 202-157 - 188/113. Her blood pressures have been in this range for two weeks.   Patient states she has been having headaches and feels like her heart is some times racing.   She denies any chest pain or shortness of breath.   No changes in medications. She has had no missed doses of Lisinopril.   I advised patient I would send a message to [facility] for advise. If she develops chest pain/SOB I advised to seek care in the ER. She does have an appt on Monday with Raja Caputi.

## 2022-05-21 NOTE — Telephone Encounter (Signed)
Called patient, she states she can not come in today.

## 2022-05-24 ENCOUNTER — Ambulatory Visit (INDEPENDENT_AMBULATORY_CARE_PROVIDER_SITE_OTHER): Payer: Self-pay | Admitting: Physician Assistant

## 2022-05-24 ENCOUNTER — Ambulatory Visit (INDEPENDENT_AMBULATORY_CARE_PROVIDER_SITE_OTHER): Payer: Self-pay

## 2022-05-24 VITALS — BP 169/73 | HR 74 | Ht 65.0 in | Wt 203.0 lb

## 2022-05-24 DIAGNOSIS — I10 Essential (primary) hypertension: Secondary | ICD-10-CM

## 2022-05-24 DIAGNOSIS — R29898 Other symptoms and signs involving the musculoskeletal system: Secondary | ICD-10-CM

## 2022-05-24 DIAGNOSIS — Z1329 Encounter for screening for other suspected endocrine disorder: Secondary | ICD-10-CM

## 2022-05-24 DIAGNOSIS — Z79899 Other long term (current) drug therapy: Secondary | ICD-10-CM

## 2022-05-24 DIAGNOSIS — R42 Dizziness and giddiness: Secondary | ICD-10-CM

## 2022-05-24 DIAGNOSIS — E782 Mixed hyperlipidemia: Secondary | ICD-10-CM

## 2022-05-24 DIAGNOSIS — Z23 Encounter for immunization: Secondary | ICD-10-CM

## 2022-05-24 MED ORDER — LISINOPRIL-HYDROCHLOROTHIAZIDE 20-25 MG PO TABS: 1.0000 | ORAL_TABLET | Freq: Every day | ORAL | 0 refills | Status: DC

## 2022-05-24 NOTE — Progress Notes (Unsigned)
Established Patient Office Visit  Subjective   Patient ID: Janice [name], female    DOB: [birth year]  Age: 51 y.o. MRN: [id number]  Chief Complaint  Patient presents with   Hypertension   Follow-up    HPI Pt is a 51 yo female with HTN, Migraines, CAD, GERD, MDD, GAD who presents to the clinic to follow up on blood pressure. She has called in with getting high blood pressure readings at home in the 200s over 100. She denies any CP, SOB, palpitations. She is having some intermittent lower extermity swelling and hand hand swelling. She is having some bilateral leg weakness but no pain. No low back pain. She does c/o of some dizziness as well but more when she changes positions. No speech changes or memory changes. She is taking lisinopril '20mg'$ .    Active Ambulatory Problems    Diagnosis Date Noted   MIGRAINE HEADACHE 11/03/2010   Insomnia 02/26/2014   Hypertriglyceridemia, essential 02/27/2014   Atherosclerosis of coronary artery 08/02/2014   Fatty liver disease, nonalcoholic 25/42/7062   Adenoma of right adrenal gland 08/02/2014   H. 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   Empty sella ([place]) 06/07/2018   Essential hypertension 12/27/2018   Chronic bilateral low back pain without sciatica 11/26/2019   Gastroesophageal reflux disease 11/26/2019   Nausea 11/28/2020   Migraine without aura and without status migrainosus, not  intractable 02/24/2021   Seasonal allergies 02/24/2021   Grief 06/08/2021   Viral gastroenteritis 11/20/2021   Allergic sinusitis 12/16/2021   Uncontrolled hypertension 12/21/2021   Dizziness 05/24/2022   Resolved Ambulatory Problems    Diagnosis Date Noted   Acute stress reaction 03/12/2016   Muscle spasm of back 03/30/2016   Past Medical History:  Diagnosis Date   Hyperlipidemia        ROS See HPI.    Objective:     BP (!) 169/73   Pulse 74   Ht '5\' 5"'$  (1.651 m)   Wt 203 lb (92.1 kg)   SpO2 98%   BMI 33.78 kg/m  BP Readings from Last 3 Encounters:  05/24/22 (!) 169/73  12/16/21 (!) 170/95  11/20/21 132/72   Wt Readings from Last 3 Encounters:  05/24/22 203 lb (92.1 kg)  12/16/21 208 lb (94.3 kg)  11/13/21 192 lb (87.1 kg)    .Marland KitchenOrthostatic VS for the past 24 hrs (Last 3 readings):  BP- Lying Pulse- Lying BP- Sitting Pulse- Sitting BP- Standing at 0 minutes Pulse- Standing at 0 minutes  05/24/22 1159 174/84 63 (!) 168/101 80 (!) 173/94 82     Physical Exam Constitutional:      Appearance: Normal appearance. She is obese.  HENT:     Head: Normocephalic.  Cardiovascular:     Rate and Rhythm: Normal rate and regular rhythm.     Pulses: Normal pulses.     Heart sounds: Normal heart sounds.  Pulmonary:     Effort: Pulmonary effort is normal.  Breath sounds: Normal breath sounds.  Musculoskeletal:     Right lower leg: No edema.     Left lower leg: No edema.     Comments: 5/5 lower extremity strength NROM at hips and knees  Neurological:     General: No focal deficit present.     Mental Status: She is alert and oriented to person, place, and time.  Psychiatric:        Mood and Affect: Mood normal.          Assessment & Plan:  Marland KitchenMarland KitchenKyndell was seen today for hypertension and follow-up.  Diagnoses and all orders for this visit:  Essential hypertension -     COMPLETE METABOLIC PANEL WITH GFR -     lisinopril-hydrochlorothiazide (ZESTORETIC)  20-25 MG tablet; Take 1 tablet by mouth daily.  Mixed hyperlipidemia -     Lipid Panel w/reflex Direct LDL  Thyroid disorder screen -     TSH  Medication management -     TSH -     Lipid Panel w/reflex Direct LDL -     COMPLETE METABOLIC PANEL WITH GFR -     CBC with Differential/Platelet  Need for shingles vaccine -     Varicella-zoster vaccine IM  Bilateral leg weakness -     DG Lumbar Spine Complete; Future  Dizziness  Uncontrolled hypertension   Orthostatic changes are not significant but maintain high readings Discussed importance of BP control Stop lisinopril Start lisinopril/HcTZ Recheck in 2 weeks Some of dizziness could be high BP readings Needs screening labs and to recheck kidney function Having some bilateral leg weakness  Lumbar xray ordered Shingles vaccine done today   Iran Planas, PA-C

## 2022-05-24 NOTE — Patient Instructions (Signed)
Lisinopril/HCTZ to start and stop just lisinopril

## 2022-05-25 ENCOUNTER — Encounter: Payer: Self-pay | Admitting: Physician Assistant

## 2022-05-25 DIAGNOSIS — R718 Other abnormality of red blood cells: Secondary | ICD-10-CM | POA: Insufficient documentation

## 2022-05-25 DIAGNOSIS — D582 Other hemoglobinopathies: Secondary | ICD-10-CM | POA: Insufficient documentation

## 2022-05-25 DIAGNOSIS — I700 Atherosclerosis of aorta: Secondary | ICD-10-CM | POA: Insufficient documentation

## 2022-05-25 DIAGNOSIS — R7989 Other specified abnormal findings of blood chemistry: Secondary | ICD-10-CM | POA: Insufficient documentation

## 2022-05-25 NOTE — Progress Notes (Signed)
You do have plaque build up in aortoilliac. Make sure to stay on lipitor and add ASA '81mg'$  daily to help prevent problems from this. I would like to order ABI to look at circulation in legs. Are you ok with ordering?   Mild lumbar degenerative disc disease.

## 2022-05-25 NOTE — Progress Notes (Signed)
Janice [name],   HDL, good cholesterol, improved some. Great job.  LDL and TG decreased some. Also great news.  Not quite to goal but getting there. Are you taking lipitor daily?   Thyroid looks great.   Liver looks great.   Your creatinine is a little elevated from previous checks. Avoid regular use of NSAIDs like mobic. We HAVE to keep BP controlled. Recheck in 2-3 months.   Your hemoglobin/RBC/hematocrit elevated. You making too many red blood cells. Make sure drinking plenty of water. Lets get BP to goal and then Recheck in 2 months. If still elevated will consider more work up then.   Glucose up some as well. Please add A1C.

## 2022-05-26 ENCOUNTER — Other Ambulatory Visit: Payer: Self-pay | Admitting: Physician Assistant

## 2022-05-26 ENCOUNTER — Encounter: Payer: Self-pay | Admitting: Physician Assistant

## 2022-05-26 DIAGNOSIS — R29898 Other symptoms and signs involving the musculoskeletal system: Secondary | ICD-10-CM

## 2022-05-26 DIAGNOSIS — I700 Atherosclerosis of aorta: Secondary | ICD-10-CM | POA: Insufficient documentation

## 2022-05-27 LAB — CBC WITH DIFFERENTIAL/PLATELET
Absolute Monocytes: 548 cells/uL (ref 200–950)
Basophils Absolute: 44 cells/uL (ref 0–200)
Basophils Relative: 0.5 %
Eosinophils Absolute: 104 cells/uL (ref 15–500)
Eosinophils Relative: 1.2 %
HCT: 47.2 % — ABNORMAL HIGH (ref 35.0–45.0)
Hemoglobin: 15.7 g/dL — ABNORMAL HIGH (ref 11.7–15.5)
Lymphs Abs: 2714 cells/uL (ref 850–3900)
MCH: 29.2 pg (ref 27.0–33.0)
MCHC: 33.3 g/dL (ref 32.0–36.0)
MCV: 87.9 fL (ref 80.0–100.0)
MPV: 9.2 fL (ref 7.5–12.5)
Monocytes Relative: 6.3 %
Neutro Abs: 5290 cells/uL (ref 1500–7800)
Neutrophils Relative %: 60.8 %
Platelets: 349 10*3/uL (ref 140–400)
RBC: 5.37 10*6/uL — ABNORMAL HIGH (ref 3.80–5.10)
RDW: 12.5 % (ref 11.0–15.0)
Total Lymphocyte: 31.2 %
WBC: 8.7 10*3/uL (ref 3.8–10.8)

## 2022-05-27 LAB — LIPID PANEL W/REFLEX DIRECT LDL
Cholesterol: 226 mg/dL — ABNORMAL HIGH (ref <200)
HDL: 53 mg/dL (ref >=50)
LDL Cholesterol (Calc): 150 mg/dL (calc) — ABNORMAL HIGH
Non-HDL Cholesterol (Calc): 173 mg/dL (calc) — ABNORMAL HIGH (ref <130)
Total CHOL/HDL Ratio: 4.3 (calc) (ref <5.0)
Triglycerides: 109 mg/dL (ref <150)

## 2022-05-27 LAB — COMPLETE METABOLIC PANEL WITH GFR
AG Ratio: 1.6 (calc) (ref 1.0–2.5)
ALT: 18 U/L (ref 6–29)
AST: 16 U/L (ref 10–35)
Albumin: 4.7 g/dL (ref 3.6–5.1)
Alkaline phosphatase (APISO): 92 U/L (ref 37–153)
BUN/Creatinine Ratio: 12 (calc) (ref 6–22)
BUN: 13 mg/dL (ref 7–25)
CO2: 25 mmol/L (ref 20–32)
Calcium: 10.4 mg/dL (ref 8.6–10.4)
Chloride: 103 mmol/L (ref 98–110)
Creat: 1.06 mg/dL — ABNORMAL HIGH (ref 0.50–1.03)
Globulin: 2.9 g/dL (calc) (ref 1.9–3.7)
Glucose, Bld: 108 mg/dL — ABNORMAL HIGH (ref 65–99)
Potassium: 5 mmol/L (ref 3.5–5.3)
Sodium: 141 mmol/L (ref 135–146)
Total Bilirubin: 0.9 mg/dL (ref 0.2–1.2)
Total Protein: 7.6 g/dL (ref 6.1–8.1)
eGFR: 64 mL/min/{1.73_m2} (ref >=60)

## 2022-05-27 LAB — TSH: TSH: 0.98 mIU/L

## 2022-05-27 LAB — TEST AUTHORIZATION

## 2022-05-27 LAB — HEMOGLOBIN A1C W/OUT EAG: Hgb A1c MFr Bld: 5.7 % of total Hgb — ABNORMAL HIGH (ref <5.7)

## 2022-05-28 ENCOUNTER — Other Ambulatory Visit: Payer: Self-pay | Admitting: Neurology

## 2022-05-28 DIAGNOSIS — R7303 Prediabetes: Secondary | ICD-10-CM

## 2022-05-28 DIAGNOSIS — I700 Atherosclerosis of aorta: Secondary | ICD-10-CM

## 2022-05-28 DIAGNOSIS — E8881 Metabolic syndrome: Secondary | ICD-10-CM

## 2022-05-28 MED ORDER — OZEMPIC (0.25 OR 0.5 MG/DOSE) 2 MG/3ML ~~LOC~~ SOPN: 0.2500 mg | PEN_INJECTOR | SUBCUTANEOUS | 1 refills | Status: DC

## 2022-05-28 NOTE — Progress Notes (Signed)
A1C is 5.7. Pre-diabetes range. Start now limiting sugars and carbs to help this not progress into diabetes. We could try to get once weekly injectable covered to help with sugars and weight loss and overall cardiac risk. Would you be interested?

## 2022-06-07 ENCOUNTER — Ambulatory Visit (HOSPITAL_COMMUNITY): Admission: RE | Admit: 2022-06-07 | Payer: No Typology Code available for payment source | Source: Ambulatory Visit

## 2022-06-07 ENCOUNTER — Encounter (HOSPITAL_COMMUNITY): Payer: Self-pay

## 2022-06-08 ENCOUNTER — Ambulatory Visit: Payer: Self-pay | Admitting: Physician Assistant

## 2022-06-29 ENCOUNTER — Other Ambulatory Visit: Payer: Self-pay | Admitting: Neurology

## 2022-06-29 DIAGNOSIS — F5101 Primary insomnia: Secondary | ICD-10-CM

## 2022-06-29 DIAGNOSIS — G8929 Other chronic pain: Secondary | ICD-10-CM

## 2022-06-29 DIAGNOSIS — F411 Generalized anxiety disorder: Secondary | ICD-10-CM

## 2022-06-29 MED ORDER — CYCLOBENZAPRINE HCL 10 MG PO TABS: ORAL_TABLET | ORAL | 1 refills | Status: DC

## 2022-06-29 MED ORDER — ALPRAZOLAM 0.5 MG PO TABS: ORAL_TABLET | ORAL | 1 refills | Status: DC

## 2022-06-29 MED ORDER — ZOLPIDEM TARTRATE 10 MG PO TABS: ORAL_TABLET | ORAL | 1 refills | Status: DC

## 2022-06-29 NOTE — Telephone Encounter (Signed)
Patient lvm asking about medication refills.   Called patient. She would like Ambien, Flexeril, and Xanax. Last appt 05/24/2022.  Ambien: last written 12/16/2021 #90 with 1 refill Flexeril: last written 03/05/2022 #90 with no refills Xanax: last written 12/16/2021 #180 with 1 refill

## 2022-07-01 ENCOUNTER — Telehealth: Payer: Self-pay

## 2022-07-01 NOTE — Telephone Encounter (Signed)
Initiated Prior authorization [id number] (0.25 or 0.5 MG/DOSE) '2MG'$ /3ML pen-injectors Via: Covermymeds Case/[id number]: Status: approved  as of 07/01/22 Reason:Approved. This drug has been approved. Approved quantity: 2 units per 28 day(s). The drug has been approved from 06/17/2022 to 07/01/2023. Please call the pharmacy to process your Notified Pt via: Mychart

## 2022-08-18 ENCOUNTER — Other Ambulatory Visit: Payer: Self-pay | Admitting: Physician Assistant

## 2022-08-18 DIAGNOSIS — G8929 Other chronic pain: Secondary | ICD-10-CM

## 2022-10-18 ENCOUNTER — Other Ambulatory Visit: Payer: Self-pay | Admitting: Physician Assistant

## 2022-10-18 DIAGNOSIS — G8929 Other chronic pain: Secondary | ICD-10-CM

## 2022-12-07 ENCOUNTER — Ambulatory Visit: Payer: No Typology Code available for payment source | Admitting: Physician Assistant

## 2022-12-07 DIAGNOSIS — R7303 Prediabetes: Secondary | ICD-10-CM

## 2022-12-07 DIAGNOSIS — I10 Essential (primary) hypertension: Secondary | ICD-10-CM

## 2022-12-09 ENCOUNTER — Other Ambulatory Visit: Payer: Self-pay | Admitting: Physician Assistant

## 2022-12-09 DIAGNOSIS — R112 Nausea with vomiting, unspecified: Secondary | ICD-10-CM

## 2022-12-09 DIAGNOSIS — R110 Nausea: Secondary | ICD-10-CM

## 2022-12-10 MED ORDER — ONDANSETRON 8 MG PO TBDP: 8.0000 mg | ORAL_TABLET | Freq: Three times a day (TID) | ORAL | 0 refills | Status: DC | PRN

## 2022-12-13 ENCOUNTER — Ambulatory Visit: Payer: Self-pay | Admitting: Physician Assistant

## 2022-12-13 DIAGNOSIS — R7303 Prediabetes: Secondary | ICD-10-CM

## 2022-12-13 DIAGNOSIS — I10 Essential (primary) hypertension: Secondary | ICD-10-CM

## 2022-12-15 ENCOUNTER — Ambulatory Visit: Payer: Self-pay | Admitting: Physician Assistant

## 2022-12-20 ENCOUNTER — Ambulatory Visit: Payer: Self-pay | Admitting: Physician Assistant

## 2022-12-22 ENCOUNTER — Encounter: Payer: Self-pay | Admitting: Physician Assistant

## 2022-12-22 ENCOUNTER — Ambulatory Visit: Payer: Self-pay

## 2022-12-22 ENCOUNTER — Ambulatory Visit (INDEPENDENT_AMBULATORY_CARE_PROVIDER_SITE_OTHER): Payer: 59 | Admitting: Physician Assistant

## 2022-12-22 VITALS — BP 141/90 | HR 99 | Wt 203.0 lb

## 2022-12-22 DIAGNOSIS — K219 Gastro-esophageal reflux disease without esophagitis: Secondary | ICD-10-CM | POA: Diagnosis not present

## 2022-12-22 DIAGNOSIS — F411 Generalized anxiety disorder: Secondary | ICD-10-CM | POA: Diagnosis not present

## 2022-12-22 DIAGNOSIS — G8929 Other chronic pain: Secondary | ICD-10-CM

## 2022-12-22 DIAGNOSIS — F4321 Adjustment disorder with depressed mood: Secondary | ICD-10-CM

## 2022-12-22 DIAGNOSIS — I10 Essential (primary) hypertension: Secondary | ICD-10-CM | POA: Diagnosis not present

## 2022-12-22 DIAGNOSIS — F5101 Primary insomnia: Secondary | ICD-10-CM | POA: Diagnosis not present

## 2022-12-22 DIAGNOSIS — M65311 Trigger thumb, right thumb: Secondary | ICD-10-CM

## 2022-12-22 DIAGNOSIS — R112 Nausea with vomiting, unspecified: Secondary | ICD-10-CM

## 2022-12-22 DIAGNOSIS — M25511 Pain in right shoulder: Secondary | ICD-10-CM

## 2022-12-22 DIAGNOSIS — R55 Syncope and collapse: Secondary | ICD-10-CM | POA: Diagnosis not present

## 2022-12-22 DIAGNOSIS — R7303 Prediabetes: Secondary | ICD-10-CM

## 2022-12-22 DIAGNOSIS — Z1159 Encounter for screening for other viral diseases: Secondary | ICD-10-CM

## 2022-12-22 DIAGNOSIS — I700 Atherosclerosis of aorta: Secondary | ICD-10-CM

## 2022-12-22 DIAGNOSIS — D582 Other hemoglobinopathies: Secondary | ICD-10-CM

## 2022-12-22 DIAGNOSIS — R519 Headache, unspecified: Secondary | ICD-10-CM

## 2022-12-22 DIAGNOSIS — E8881 Metabolic syndrome: Secondary | ICD-10-CM | POA: Diagnosis not present

## 2022-12-22 DIAGNOSIS — R110 Nausea: Secondary | ICD-10-CM | POA: Diagnosis not present

## 2022-12-22 DIAGNOSIS — Z1329 Encounter for screening for other suspected endocrine disorder: Secondary | ICD-10-CM

## 2022-12-22 DIAGNOSIS — I708 Atherosclerosis of other arteries: Secondary | ICD-10-CM

## 2022-12-22 MED ORDER — PANTOPRAZOLE SODIUM 40 MG PO TBEC: 40.0000 mg | DELAYED_RELEASE_TABLET | Freq: Every day | ORAL | 3 refills | Status: DC

## 2022-12-22 MED ORDER — BUPROPION HCL ER (XL) 300 MG PO TB24: 300.0000 mg | ORAL_TABLET | Freq: Every day | ORAL | 1 refills | Status: DC

## 2022-12-22 MED ORDER — ALPRAZOLAM 0.5 MG PO TABS: ORAL_TABLET | ORAL | 1 refills | Status: DC

## 2022-12-22 MED ORDER — ESCITALOPRAM OXALATE 10 MG PO TABS: 10.0000 mg | ORAL_TABLET | Freq: Every day | ORAL | 1 refills | Status: DC

## 2022-12-22 MED ORDER — ZOLPIDEM TARTRATE 10 MG PO TABS: ORAL_TABLET | ORAL | 1 refills | Status: DC

## 2022-12-22 MED ORDER — PROMETHAZINE HCL 25 MG PO TABS: 25.0000 mg | ORAL_TABLET | Freq: Four times a day (QID) | ORAL | 0 refills | Status: DC | PRN

## 2022-12-22 MED ORDER — MELOXICAM 7.5 MG PO TABS: 7.5000 mg | ORAL_TABLET | Freq: Every day | ORAL | 1 refills | Status: DC

## 2022-12-22 MED ORDER — LISINOPRIL-HYDROCHLOROTHIAZIDE 20-25 MG PO TABS: 1.0000 | ORAL_TABLET | Freq: Every day | ORAL | 1 refills | Status: DC

## 2022-12-27 ENCOUNTER — Encounter: Payer: Self-pay | Admitting: Physician Assistant

## 2022-12-27 NOTE — Progress Notes (Signed)
Established Patient Office Visit  Subjective   Patient ID: Janice [name], female    DOB: Jul 24, 1971  Age: 52 y.o. MRN: [id number]  Chief Complaint  Patient presents with   Follow-up    HPI Pt is a 52 yo obese female with HTN, Aortic atherosclerosis, Migraines, Pre-diabetes, GAD who presents to the clinic for medication refills.   Pt has been having episodes where she is concerned she is having a stroke. She is taking her medications. She denies any CP, palpitations. She is having episodes where she is confused, vision seems blurry, hard to speak. At times she feels like she is going to pass out. She has not passed out. She has dull headaches off and on.  No muscle weakness or strength changes.  She has known atherosclerosis.   Pt has some ongoing right shoulder pain. It has not been worked up before. No known injury. Not done anything to make better.   Mood is ok. No SI/HC. She continues to struggle with mothers death. Not in counseling.    Active Ambulatory Problems    Diagnosis Date Noted   MIGRAINE HEADACHE 11/03/2010   Insomnia 02/26/2014   Hypertriglyceridemia, essential 02/27/2014   Atherosclerosis of coronary artery 08/02/2014   Fatty liver disease, nonalcoholic [id number]   Adenoma of right adrenal gland 08/02/2014   H. 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   Empty sella ([place]) 06/07/2018   Essential  hypertension 12/27/2018   Chronic bilateral low back pain without sciatica 11/26/2019   Gastroesophageal reflux disease 11/26/2019   Nausea 11/28/2020   Migraine without aura and without status migrainosus, not intractable 02/24/2021   Seasonal allergies 02/24/2021   Grief 06/08/2021   Viral gastroenteritis 11/20/2021   Allergic sinusitis 12/16/2021   Uncontrolled hypertension 12/21/2021   Dizziness 05/24/2022   Elevated hematocrit 05/25/2022   Elevated hemoglobin (HCC) 05/25/2022   Elevated red blood cell count 05/25/2022   Elevated serum creatinine 05/25/2022   Aortic atherosclerosis ([place]) 05/25/2022   Aorto-iliac atherosclerosis ([place]) 05/26/2022   Pre-diabetes 12/22/2022   Resolved Ambulatory Problems    Diagnosis Date Noted   Acute stress reaction 03/12/2016   Muscle spasm of back 03/30/2016   Past Medical History:  Diagnosis Date   Hyperlipidemia      ROS See HPI.    Objective:     BP (!) 141/90   Pulse 99   Wt 203 lb (92.1 kg)   SpO2 97%   BMI 33.78 kg/m  BP Readings from Last 3 Encounters:  12/22/22 (!) 141/90  05/24/22 (!) 169/73  12/16/21 (!) 170/95   Wt Readings from Last 3 Encounters:  12/22/22 203 lb (92.1 kg)  05/24/22 203 lb (92.1 kg)  12/16/21 208 lb (94.3 kg)    Physical Exam Constitutional:      Appearance: Normal appearance. She is obese.  HENT:     Head: Normocephalic.  Eyes:     Conjunctiva/sclera: Conjunctivae normal.     Pupils:  Pupils are equal, round, and reactive to light.  Neck:     Vascular: No carotid bruit.  Cardiovascular:     Rate and Rhythm: Normal rate.     Pulses: Normal pulses.  Pulmonary:     Effort: Pulmonary effort is normal.  Musculoskeletal:     Cervical back: Normal range of motion and neck supple. No rigidity or tenderness.     Right lower leg: No edema.     Left lower leg: No edema.  Lymphadenopathy:     Cervical: No cervical adenopathy.  Neurological:     General: No focal deficit present.      Mental Status: She is alert and oriented to person, place, and time.  Psychiatric:        Mood and Affect: Mood normal.       The 10-year ASCVD risk score (Arnett DK, et al., 2019) is: 2.9%    Assessment & Plan:  Marland KitchenMarland KitchenSeini was seen today for follow-up.  Diagnoses and all orders for this visit:  near syncope -     MR Brain W Wo Contrast; Future  Primary insomnia -     zolpidem (AMBIEN) 10 MG tablet; TAKE ONE TABLET BY MOUTH ONE TIME DAILY AT BEDTIME  Nausea -     promethazine (PHENERGAN) 25 MG tablet; Take 1 tablet (25 mg total) by mouth every 6 (six) hours as needed for nausea or vomiting.  Non-intractable vomiting with nausea -     promethazine (PHENERGAN) 25 MG tablet; Take 1 tablet (25 mg total) by mouth every 6 (six) hours as needed for nausea or vomiting.  Gastroesophageal reflux disease, unspecified whether esophagitis present -     pantoprazole (PROTONIX) 40 MG tablet; Take 1 tablet (40 mg total) by mouth daily.  Trigger finger of right thumb -     meloxicam (MOBIC) 7.5 MG tablet; Take 1-2 tablets (7.5-15 mg total) by mouth daily.  GAD (generalized anxiety disorder) -     ALPRAZolam (XANAX) 0.5 MG tablet; Take one tablet as needed up to twice a day for anxiety. -     buPROPion (WELLBUTRIN XL) 300 MG 24 hr tablet; Take 1 tablet (300 mg total) by mouth daily.  Essential hypertension -     lisinopril-hydrochlorothiazide (ZESTORETIC) 20-25 MG tablet; Take 1 tablet by mouth daily. -     COMPLETE METABOLIC PANEL WITH GFR  Grief -     escitalopram (LEXAPRO) 10 MG tablet; Take 1 tablet (10 mg total) by mouth daily.  Morbid obesity (HCC)  Metabolic syndrome  Pre-diabetes -     Hemoglobin A1c  Aorto-iliac atherosclerosis (HCC) -     Lipid Panel w/reflex Direct LDL  Aortic atherosclerosis (HCC)  Elevated hemoglobin (HCC) -     CBC w/Diff/Platelet -     Fe+TIBC+Fer  Chronic right shoulder pain -     DG Shoulder Right; Future  Frequent headaches -     MR  Brain W Wo Contrast; Future  Thyroid disorder screening -     TSH  Encounter for hepatitis C screening test for low risk patient -     Hepatitis C Antibody   Fasting labs ordered BP not to goal, recheck in 1 month nurse visit MR for brain and confusion episode and headaches Xray for shoulder ordered Pt reports 5mg  of Lorrin Mais has not worked and only 10mg  works. She has been controlled on this for years Follow up in 3 months  Return in about 3 months (around 03/24/2023).    Iran Planas,  PA-C

## 2023-03-25 ENCOUNTER — Ambulatory Visit: Payer: Self-pay | Admitting: Physician Assistant

## 2023-04-01 ENCOUNTER — Ambulatory Visit: Payer: Self-pay | Admitting: Physician Assistant

## 2023-04-04 ENCOUNTER — Encounter: Payer: Self-pay | Admitting: Physician Assistant

## 2023-04-04 ENCOUNTER — Ambulatory Visit (INDEPENDENT_AMBULATORY_CARE_PROVIDER_SITE_OTHER): Payer: 59 | Admitting: Physician Assistant

## 2023-04-04 VITALS — BP 107/73 | HR 91 | Ht 65.0 in | Wt 195.0 lb

## 2023-04-04 DIAGNOSIS — I700 Atherosclerosis of aorta: Secondary | ICD-10-CM | POA: Diagnosis not present

## 2023-04-04 DIAGNOSIS — F411 Generalized anxiety disorder: Secondary | ICD-10-CM

## 2023-04-04 DIAGNOSIS — R55 Syncope and collapse: Secondary | ICD-10-CM

## 2023-04-04 DIAGNOSIS — R413 Other amnesia: Secondary | ICD-10-CM

## 2023-04-04 DIAGNOSIS — F4321 Adjustment disorder with depressed mood: Secondary | ICD-10-CM

## 2023-04-04 DIAGNOSIS — I10 Essential (primary) hypertension: Secondary | ICD-10-CM

## 2023-04-04 DIAGNOSIS — Z1211 Encounter for screening for malignant neoplasm of colon: Secondary | ICD-10-CM

## 2023-04-04 DIAGNOSIS — E8881 Metabolic syndrome: Secondary | ICD-10-CM

## 2023-04-04 DIAGNOSIS — R7303 Prediabetes: Secondary | ICD-10-CM | POA: Diagnosis not present

## 2023-04-04 DIAGNOSIS — M545 Low back pain, unspecified: Secondary | ICD-10-CM

## 2023-04-04 DIAGNOSIS — Z1231 Encounter for screening mammogram for malignant neoplasm of breast: Secondary | ICD-10-CM

## 2023-04-04 DIAGNOSIS — H5703 Miosis: Secondary | ICD-10-CM | POA: Diagnosis not present

## 2023-04-04 DIAGNOSIS — R4589 Other symptoms and signs involving emotional state: Secondary | ICD-10-CM

## 2023-04-04 DIAGNOSIS — R29898 Other symptoms and signs involving the musculoskeletal system: Secondary | ICD-10-CM

## 2023-04-04 DIAGNOSIS — G8929 Other chronic pain: Secondary | ICD-10-CM

## 2023-04-04 MED ORDER — LISINOPRIL-HYDROCHLOROTHIAZIDE 20-25 MG PO TABS
2.0000 | ORAL_TABLET | Freq: Every day | ORAL | 1 refills | Status: DC
Start: 2023-04-04 — End: 2023-12-23

## 2023-04-04 MED ORDER — OZEMPIC (0.25 OR 0.5 MG/DOSE) 2 MG/3ML ~~LOC~~ SOPN: 0.5000 mg | PEN_INJECTOR | SUBCUTANEOUS | 0 refills | Status: DC

## 2023-04-04 MED ORDER — TIZANIDINE HCL 4 MG PO TABS
4.0000 mg | ORAL_TABLET | Freq: Three times a day (TID) | ORAL | 1 refills | Status: AC | PRN
Start: 2023-04-04 — End: 2023-04-14

## 2023-04-04 NOTE — Patient Instructions (Signed)
Get labs.  Get MRI.  

## 2023-04-04 NOTE — Progress Notes (Signed)
Established Patient Office Visit  Subjective   Patient ID: Janice [name], female    DOB: [birth year]  Age: 52 y.o. MRN: [id number]  Chief Complaint  Patient presents with   Follow-up    Mood   Hypertension    HPI Pt is a 52 yo obese female with HTN, Migraines, GERD, chronic low back pain,   She was concerned about low BP so decreased to 1 tablet of lisinopril. She is checking at home and running 110s/80s.   She did pass out 2 months ago but not sure why. She was found by her father. Not sure how long she was "out". She was weak for 4 days later. Struggling with memory. Vision is blurry from time to time.   Anxiety seems to be ok and not taking xanax daily. None done in a few days.     Patient Active Problem List   Diagnosis Date Noted   Blunted affect 04/05/2023   Pinpoint pupils noted on examination 04/04/2023   Morbid obesity (HCC) 04/04/2023   Syncope 04/04/2023   Bilateral leg weakness 04/04/2023   Pre-diabetes 12/22/2022   Aorto-iliac atherosclerosis (HCC) 05/26/2022   Elevated hematocrit 05/25/2022   Elevated hemoglobin (HCC) 05/25/2022   Elevated red blood cell count 05/25/2022   Elevated serum creatinine 05/25/2022   Aortic atherosclerosis (HCC) 05/25/2022   Dizziness 05/24/2022   Uncontrolled hypertension 12/21/2021   Allergic sinusitis 12/16/2021   Viral gastroenteritis 11/20/2021   Grief 06/08/2021   Migraine without aura and without status migrainosus, not intractable 02/24/2021   Seasonal allergies 02/24/2021   Nausea 11/28/2020   Chronic bilateral low back pain without sciatica 11/26/2019   Gastroesophageal reflux disease 11/26/2019   Essential hypertension 12/27/2018   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 pylori infection 08/05/2014   Atherosclerosis of coronary artery 08/02/2014   Fatty liver disease, nonalcoholic 08/02/2014   Adenoma of right adrenal gland 08/02/2014   Hypertriglyceridemia, essential 02/27/2014   Insomnia 02/26/2014   MIGRAINE HEADACHE 11/03/2010   Past Medical History:  Diagnosis Date   Essential hypertension 12/27/2018   Hyperlipidemia    Past Surgical History:  Procedure Laterality Date   ABDOMINAL HYSTERECTOMY     APPENDECTOMY     CHOLECYSTECTOMY     Family History  Problem Relation Age of Onset   Cancer Mother    Heart attack Mother    Hyperlipidemia Mother    Hypertension Mother    Stroke Mother    Cancer Maternal Grandmother    Hyperlipidemia Father    Hypertension Father    Healthy Sister    Healthy Brother    No Known Allergies    ROS See HPI.    Objective:     BP 107/73 (BP Location: Right Arm, Patient Position: Sitting, Cuff Size: Large)   Pulse 91   Ht 5\' 5"  (1.651 m)   Wt 195 lb 0.6 oz (88.5 kg)   SpO2 91%  BMI 32.46 kg/m  BP Readings from Last 3 Encounters:  04/04/23 107/73  12/22/22 (!) 141/90  05/24/22 (!) 169/73   Wt Readings from Last 3 Encounters:  04/04/23 195 lb 0.6 oz (88.5 kg)  12/22/22 203 lb (92.1 kg)  05/24/22 203 lb (92.1 kg)    ..    04/04/2023    2:49 PM  Montreal Cognitive Assessment   Visuospatial/ Executive (0/5) 3  Naming (0/3) 3  Attention: Read list of digits (0/2) 1  Attention: Read list of letters (0/1) 1  Attention: Serial 7 subtraction starting at 100 (0/3) 3  Language: Repeat phrase (0/2) 2  Language : Fluency (0/1) 0  Abstraction (0/2) 1  Delayed Recall  (0/5) 4  Orientation (0/6) 6  Total 24   .[name]    04/04/2023    2:52 PM 04/04/2023    2:47 PM 05/24/2022   11:58 AM 12/16/2021   11:33 AM 07/09/2021    3:43 PM  Depression screen PHQ 2/9  Decreased Interest 0 1 1 0 1  Down, Depressed, Hopeless 0 1 1 0 1  PHQ - 2 Score 0 2 2 0 2  Altered sleeping 1  1  3   Tired, decreased energy 2  1  1   Change in appetite 1  1  1   Feeling bad or failure about yourself  1  1  1   Trouble concentrating 1  1  1   Moving slowly or fidgety/restless 1  1  0  Suicidal thoughts 0  0  0  PHQ-9 Score 7  8  9   Difficult doing work/chores Not difficult at all  Somewhat difficult  Not difficult at all   ..    04/04/2023    2:52 PM 05/24/2022   11:58 AM 07/09/2021    3:43 PM 11/24/2020    3:49 PM  GAD 7 : Generalized Anxiety Score  Nervous, Anxious, on Edge 0 1 1 2   Control/stop worrying 0 1 0 1  Worry too much - different things 0 1 1 1   Trouble relaxing 1 1 1 1   Restless 0 1 0 1  Easily annoyed or irritable 1 1 0 1  Afraid - awful might happen 1 1 0 0  Total GAD 7 Score 3 7 3 7   Anxiety Difficulty Somewhat difficult Somewhat difficult Not difficult at all       Physical Exam Constitutional:      Appearance: Normal appearance. She is obese.  HENT:     Head: Normocephalic.  Eyes:     Conjunctiva/sclera: Conjunctivae normal.     Comments: Pinpoint pupils  Cardiovascular:     Rate and Rhythm: Normal rate and regular rhythm.  Pulmonary:     Effort: Pulmonary effort is normal.     Breath sounds: Normal breath sounds.  Musculoskeletal:        General: No swelling, tenderness, deformity or signs of injury. Normal range of motion.     Right lower leg: No edema.     Left lower leg: No edema.     Comments: 5/5 strength lower legs  Neurological:     General: No focal deficit present.     Mental Status: She is alert and oriented to person, place, and time.  Psychiatric:     Comments: Blunted affect      The 10-year ASCVD risk score (Arnett DK, et  al., 2019) is: 1.5%    Assessment & Plan:  Marland KitchenMarland KitchenLucine was seen today for follow-up and hypertension.  Diagnoses and  all orders for this visit:  Essential hypertension -     lisinopril-hydrochlorothiazide (ZESTORETIC) 20-25 MG tablet; Take 2 tablets by mouth daily.  Visit for screening mammogram -     MM 3D SCREENING MAMMOGRAM BILATERAL BREAST; Future  Colon cancer screening -     Ambulatory referral to Gastroenterology  Chronic bilateral low back pain without sciatica -     tiZANidine (ZANAFLEX) 4 MG tablet; Take 1 tablet (4 mg total) by mouth every 8 (eight) hours as needed for up to 10 days for muscle spasms.  Morbid obesity (HCC) -     Semaglutide,0.25 or 0.5MG /DOS, (OZEMPIC, 0.25 OR 0.5 MG/DOSE,) 2 MG/3ML SOPN; Inject 0.5 mg into the skin once a week.  Metabolic syndrome -     Semaglutide,0.25 or 0.5MG /DOS, (OZEMPIC, 0.25 OR 0.5 MG/DOSE,) 2 MG/3ML SOPN; Inject 0.5 mg into the skin once a week.  Pre-diabetes -     Semaglutide,0.25 or 0.5MG /DOS, (OZEMPIC, 0.25 OR 0.5 MG/DOSE,) 2 MG/3ML SOPN; Inject 0.5 mg into the skin once a week.  Aorto-iliac atherosclerosis (HCC) -     Semaglutide,0.25 or 0.5MG /DOS, (OZEMPIC, 0.25 OR 0.5 MG/DOSE,) 2 MG/3ML SOPN; Inject 0.5 mg into the skin once a week.  Pinpoint pupils noted on examination -     DRUG MONITORING, PANEL 6 WITH CONFIRMATION, URINE -     DM TEMPLATE -     MR Brain Wo Contrast; Future  Syncope, unspecified syncope type -     MR Brain Wo Contrast; Future  Bilateral leg weakness -     DM TEMPLATE  Blunted affect -     DM TEMPLATE -     MR Brain Wo Contrast; Future  Memory changes -     MR Brain Wo Contrast; Future  Grief -     escitalopram (LEXAPRO) 10 MG tablet; Take 1 tablet (10 mg total) by mouth daily.  GAD (generalized anxiety disorder) -     buPROPion (WELLBUTRIN XL) 300 MG 24 hr tablet; Take 1 tablet (300 mg total) by mouth daily.   BP to goal Continue same lisinopril/HcTZ, sent refills stay at 1 tablet  daily.  Pinpoint pupils and flat affect UDS ordered Medications refilled for metabolic syndrome  Reviewed labs that were just completed.  Pre-diabetes/low protein LDL to goal MOCA slightly decreased Get MRI for memory changes/personality changes/decrease in [facility]   Mammogram and colon cancer screening ordered today  Return in about 4 weeks (around 05/02/2023).    Tandy Gaw, PA-C

## 2023-04-05 DIAGNOSIS — I708 Atherosclerosis of other arteries: Secondary | ICD-10-CM | POA: Diagnosis not present

## 2023-04-05 DIAGNOSIS — R4589 Other symptoms and signs involving emotional state: Secondary | ICD-10-CM | POA: Insufficient documentation

## 2023-04-05 DIAGNOSIS — Z1159 Encounter for screening for other viral diseases: Secondary | ICD-10-CM | POA: Diagnosis not present

## 2023-04-05 DIAGNOSIS — I10 Essential (primary) hypertension: Secondary | ICD-10-CM | POA: Diagnosis not present

## 2023-04-05 DIAGNOSIS — R7303 Prediabetes: Secondary | ICD-10-CM | POA: Diagnosis not present

## 2023-04-05 DIAGNOSIS — I700 Atherosclerosis of aorta: Secondary | ICD-10-CM | POA: Diagnosis not present

## 2023-04-05 DIAGNOSIS — D582 Other hemoglobinopathies: Secondary | ICD-10-CM | POA: Diagnosis not present

## 2023-04-05 DIAGNOSIS — Z1329 Encounter for screening for other suspected endocrine disorder: Secondary | ICD-10-CM | POA: Diagnosis not present

## 2023-04-06 LAB — IRON,TIBC AND FERRITIN PANEL
%SAT: 37 % (calc) (ref 16–45)
Ferritin: 182 ng/mL (ref 16–232)
Iron: 113 ug/dL (ref 45–160)
TIBC: 309 mcg/dL (calc) (ref 250–450)

## 2023-04-06 LAB — COMPLETE METABOLIC PANEL WITH GFR
AG Ratio: 1.7 (calc) (ref 1.0–2.5)
ALT: 17 U/L (ref 6–29)
AST: 17 U/L (ref 10–35)
Albumin: 3.8 g/dL (ref 3.6–5.1)
Alkaline phosphatase (APISO): 72 U/L (ref 37–153)
BUN: 11 mg/dL (ref 7–25)
CO2: 29 mmol/L (ref 20–32)
Calcium: 9.5 mg/dL (ref 8.6–10.4)
Chloride: 104 mmol/L (ref 98–110)
Creat: 0.79 mg/dL (ref 0.50–1.03)
Globulin: 2.2 g/dL (calc) (ref 1.9–3.7)
Glucose, Bld: 139 mg/dL — ABNORMAL HIGH (ref 65–99)
Potassium: 4.6 mmol/L (ref 3.5–5.3)
Sodium: 141 mmol/L (ref 135–146)
Total Bilirubin: 1 mg/dL (ref 0.2–1.2)
Total Protein: 6 g/dL — ABNORMAL LOW (ref 6.1–8.1)
eGFR: 90 mL/min/{1.73_m2} (ref >=60)

## 2023-04-06 LAB — CBC WITH DIFFERENTIAL/PLATELET
Absolute Monocytes: 563 cells/uL (ref 200–950)
Basophils Absolute: 49 cells/uL (ref 0–200)
Basophils Relative: 0.5 %
Eosinophils Absolute: 252 cells/uL (ref 15–500)
Eosinophils Relative: 2.6 %
HCT: 40.9 % (ref 35.0–45.0)
Hemoglobin: 13.3 g/dL (ref 11.7–15.5)
Lymphs Abs: 2115 cells/uL (ref 850–3900)
MCH: 29.2 pg (ref 27.0–33.0)
MCHC: 32.5 g/dL (ref 32.0–36.0)
MCV: 89.7 fL (ref 80.0–100.0)
MPV: 9.3 fL (ref 7.5–12.5)
Monocytes Relative: 5.8 %
Neutro Abs: 6722 cells/uL (ref 1500–7800)
Neutrophils Relative %: 69.3 %
Platelets: 303 10*3/uL (ref 140–400)
RBC: 4.56 10*6/uL (ref 3.80–5.10)
RDW: 13.1 % (ref 11.0–15.0)
Total Lymphocyte: 21.8 %
WBC: 9.7 10*3/uL (ref 3.8–10.8)

## 2023-04-06 LAB — HEPATITIS C ANTIBODY: Hepatitis C Ab: NONREACTIVE

## 2023-04-06 LAB — HEMOGLOBIN A1C
Hgb A1c MFr Bld: 6 % of total Hgb — ABNORMAL HIGH (ref <5.7)
Mean Plasma Glucose: 126 mg/dL
eAG (mmol/L): 7 mmol/L

## 2023-04-06 LAB — LIPID PANEL W/REFLEX DIRECT LDL
Cholesterol: 176 mg/dL (ref <200)
HDL: 46 mg/dL — ABNORMAL LOW (ref >=50)
LDL Cholesterol (Calc): 98 mg/dL (calc)
Non-HDL Cholesterol (Calc): 130 mg/dL (calc) — ABNORMAL HIGH (ref <130)
Total CHOL/HDL Ratio: 3.8 (calc) (ref <5.0)
Triglycerides: 196 mg/dL — ABNORMAL HIGH (ref <150)

## 2023-04-06 LAB — TSH: TSH: 0.41 mIU/L

## 2023-04-06 NOTE — Progress Notes (Signed)
Janice [name],   Kidney function is better than last check.  Liver function looks good.  Increase protein in diet.  A1C in pre-diabetes range and increased from 10 months ago.  LDL much better.  Thyroid looks good.

## 2023-04-07 LAB — DRUG MONITORING, PANEL 6 WITH CONFIRMATION, URINE
6 Acetylmorphine: NEGATIVE ng/mL (ref <10)
Alcohol Metabolites: NEGATIVE ng/mL (ref <500)
Amphetamines: NEGATIVE ng/mL (ref <500)
Barbiturates: NEGATIVE ng/mL (ref <300)
Benzodiazepines: NEGATIVE ng/mL (ref <100)
Cocaine Metabolite: NEGATIVE ng/mL (ref <150)
Codeine: 182 ng/mL — ABNORMAL HIGH (ref <50)
Creatinine: >300 mg/dL (ref >=20.0)
Hydrocodone: NEGATIVE ng/mL (ref <50)
Hydromorphone: 202 ng/mL — ABNORMAL HIGH (ref <50)
Marijuana Metabolite: NEGATIVE ng/mL (ref <20)
Methadone Metabolite: NEGATIVE ng/mL (ref <100)
Morphine: >10000 ng/mL — ABNORMAL HIGH (ref <50)
Norhydrocodone: NEGATIVE ng/mL (ref <50)
Opiates: POSITIVE ng/mL — AB (ref <100)
Oxidant: NEGATIVE ug/mL (ref <200)
Oxycodone: NEGATIVE ng/mL (ref <100)
Phencyclidine: NEGATIVE ng/mL (ref <25)
pH: 5.7 (ref 4.5–9.0)

## 2023-04-07 LAB — DM TEMPLATE

## 2023-04-08 ENCOUNTER — Other Ambulatory Visit: Payer: Self-pay | Admitting: Physician Assistant

## 2023-04-08 MED ORDER — BUPROPION HCL ER (XL) 300 MG PO TB24
300.0000 mg | ORAL_TABLET | Freq: Every day | ORAL | 1 refills | Status: DC
Start: 2023-04-08 — End: 2023-12-23

## 2023-04-08 MED ORDER — ESCITALOPRAM OXALATE 10 MG PO TABS
10.0000 mg | ORAL_TABLET | Freq: Every day | ORAL | 1 refills | Status: DC
Start: 2023-04-08 — End: 2023-05-06

## 2023-04-08 MED ORDER — TRULICITY 0.75 MG/0.5ML ~~LOC~~ SOAJ: 0.7500 mg | SUBCUTANEOUS | 0 refills | Status: DC

## 2023-04-08 NOTE — Progress Notes (Signed)
Insurance will not pay for ozempic but letter stated would pay for trulicity. Sent trulicity.

## 2023-04-08 NOTE — Progress Notes (Signed)
Can we see about adding confirmatory testing and see if they can test for herion specifically?

## 2023-04-09 DIAGNOSIS — I6381 Other cerebral infarction due to occlusion or stenosis of small artery: Secondary | ICD-10-CM | POA: Diagnosis not present

## 2023-04-09 DIAGNOSIS — R509 Fever, unspecified: Secondary | ICD-10-CM | POA: Diagnosis not present

## 2023-04-09 DIAGNOSIS — W19XXXA Unspecified fall, initial encounter: Secondary | ICD-10-CM | POA: Diagnosis not present

## 2023-04-09 DIAGNOSIS — S0083XA Contusion of other part of head, initial encounter: Secondary | ICD-10-CM | POA: Diagnosis not present

## 2023-04-09 DIAGNOSIS — J189 Pneumonia, unspecified organism: Secondary | ICD-10-CM | POA: Diagnosis not present

## 2023-04-09 DIAGNOSIS — R569 Unspecified convulsions: Secondary | ICD-10-CM | POA: Diagnosis not present

## 2023-04-09 DIAGNOSIS — E8770 Fluid overload, unspecified: Secondary | ICD-10-CM | POA: Diagnosis not present

## 2023-04-09 DIAGNOSIS — R000 Tachycardia, unspecified: Secondary | ICD-10-CM | POA: Diagnosis not present

## 2023-04-09 DIAGNOSIS — J9601 Acute respiratory failure with hypoxia: Secondary | ICD-10-CM | POA: Diagnosis not present

## 2023-04-09 DIAGNOSIS — R918 Other nonspecific abnormal finding of lung field: Secondary | ICD-10-CM | POA: Diagnosis not present

## 2023-04-09 DIAGNOSIS — N179 Acute kidney failure, unspecified: Secondary | ICD-10-CM | POA: Diagnosis not present

## 2023-04-09 DIAGNOSIS — R0989 Other specified symptoms and signs involving the circulatory and respiratory systems: Secondary | ICD-10-CM | POA: Diagnosis not present

## 2023-04-09 DIAGNOSIS — R55 Syncope and collapse: Secondary | ICD-10-CM | POA: Diagnosis not present

## 2023-04-09 DIAGNOSIS — R0902 Hypoxemia: Secondary | ICD-10-CM | POA: Diagnosis not present

## 2023-04-09 DIAGNOSIS — R778 Other specified abnormalities of plasma proteins: Secondary | ICD-10-CM | POA: Diagnosis not present

## 2023-04-10 DIAGNOSIS — F1410 Cocaine abuse, uncomplicated: Secondary | ICD-10-CM | POA: Diagnosis not present

## 2023-04-10 DIAGNOSIS — I871 Compression of vein: Secondary | ICD-10-CM | POA: Diagnosis not present

## 2023-04-10 DIAGNOSIS — I2489 Other forms of acute ischemic heart disease: Secondary | ICD-10-CM | POA: Diagnosis not present

## 2023-04-10 DIAGNOSIS — N3001 Acute cystitis with hematuria: Secondary | ICD-10-CM | POA: Diagnosis not present

## 2023-04-10 DIAGNOSIS — E669 Obesity, unspecified: Secondary | ICD-10-CM | POA: Diagnosis not present

## 2023-04-10 DIAGNOSIS — E119 Type 2 diabetes mellitus without complications: Secondary | ICD-10-CM | POA: Diagnosis not present

## 2023-04-10 DIAGNOSIS — N179 Acute kidney failure, unspecified: Secondary | ICD-10-CM | POA: Diagnosis not present

## 2023-04-10 DIAGNOSIS — I6381 Other cerebral infarction due to occlusion or stenosis of small artery: Secondary | ICD-10-CM | POA: Diagnosis not present

## 2023-04-10 DIAGNOSIS — J9811 Atelectasis: Secondary | ICD-10-CM | POA: Diagnosis not present

## 2023-04-10 DIAGNOSIS — R0902 Hypoxemia: Secondary | ICD-10-CM | POA: Diagnosis not present

## 2023-04-10 DIAGNOSIS — I2510 Atherosclerotic heart disease of native coronary artery without angina pectoris: Secondary | ICD-10-CM | POA: Diagnosis not present

## 2023-04-10 DIAGNOSIS — J9601 Acute respiratory failure with hypoxia: Secondary | ICD-10-CM | POA: Diagnosis not present

## 2023-04-10 DIAGNOSIS — R000 Tachycardia, unspecified: Secondary | ICD-10-CM | POA: Diagnosis not present

## 2023-04-10 DIAGNOSIS — I700 Atherosclerosis of aorta: Secondary | ICD-10-CM | POA: Diagnosis not present

## 2023-04-10 DIAGNOSIS — I161 Hypertensive emergency: Secondary | ICD-10-CM | POA: Diagnosis not present

## 2023-04-10 DIAGNOSIS — A419 Sepsis, unspecified organism: Secondary | ICD-10-CM | POA: Diagnosis not present

## 2023-04-10 DIAGNOSIS — J189 Pneumonia, unspecified organism: Secondary | ICD-10-CM | POA: Diagnosis not present

## 2023-04-10 DIAGNOSIS — R778 Other specified abnormalities of plasma proteins: Secondary | ICD-10-CM | POA: Diagnosis not present

## 2023-04-10 DIAGNOSIS — E8770 Fluid overload, unspecified: Secondary | ICD-10-CM | POA: Diagnosis not present

## 2023-04-10 DIAGNOSIS — R509 Fever, unspecified: Secondary | ICD-10-CM | POA: Diagnosis not present

## 2023-04-10 DIAGNOSIS — S0083XA Contusion of other part of head, initial encounter: Secondary | ICD-10-CM | POA: Diagnosis not present

## 2023-04-10 DIAGNOSIS — R55 Syncope and collapse: Secondary | ICD-10-CM | POA: Diagnosis not present

## 2023-04-10 DIAGNOSIS — R918 Other nonspecific abnormal finding of lung field: Secondary | ICD-10-CM | POA: Diagnosis not present

## 2023-04-10 DIAGNOSIS — R0989 Other specified symptoms and signs involving the circulatory and respiratory systems: Secondary | ICD-10-CM | POA: Diagnosis not present

## 2023-04-11 NOTE — Progress Notes (Signed)
[name],   Your have opioids/morphine detected in your system. What are you taking? Are you using any other non prescription drugs? This could be the reason for the "spaced out" feeling you are having.

## 2023-04-12 DIAGNOSIS — R55 Syncope and collapse: Secondary | ICD-10-CM | POA: Diagnosis not present

## 2023-04-13 ENCOUNTER — Telehealth: Payer: Self-pay

## 2023-04-13 NOTE — Transitions of Care (Post Inpatient/ED Visit) (Signed)
   04/13/2023  Name: Janice [name] MRN: [id number] DOB: Jun 27, 1971  Today's TOC FU Call Status: Today's TOC FU Call Status:: Unsuccessful Call (2nd Attempt) Unsuccessful Call (1st Attempt) Date: 04/13/23 Unsuccessful Call (2nd Attempt) Date: 04/13/23  Attempted to reach the patient regarding the most recent Inpatient/ED visit.  Follow Up Plan: Additional outreach attempts will be made to reach the patient to complete the Transitions of Care (Post Inpatient/ED visit) call.   Signature Karena Addison, LPN [facility] Nurse Health Advisor Direct Dial [phone]

## 2023-04-13 NOTE — Transitions of Care (Post Inpatient/ED Visit) (Signed)
   04/13/2023  Name: Janice [name] MRN: [id number] DOB: December 18, 1970  Today's TOC FU Call Status: Today's TOC FU Call Status:: Unsuccessul Call (1st Attempt) Unsuccessful Call (1st Attempt) Date: 04/13/23  Attempted to reach the patient regarding the most recent Inpatient/ED visit.  Follow Up Plan: Additional outreach attempts will be made to reach the patient to complete the Transitions of Care (Post Inpatient/ED visit) call.   Signature Karena Addison, LPN [facility] Nurse Health Advisor Direct Dial [phone]

## 2023-04-18 NOTE — Transitions of Care (Post Inpatient/ED Visit) (Signed)
   04/18/2023  Name: AKITA KRUMWIEDE MRN: [id number] DOB: 07/28/1971  Today's TOC FU Call Status: Today's TOC FU Call Status:: Unsuccessful Call (3rd Attempt) Unsuccessful Call (1st Attempt) Date: 04/13/23 Unsuccessful Call (2nd Attempt) Date: 04/13/23 Unsuccessful Call (3rd Attempt) Date: 04/18/23  Attempted to reach the patient regarding the most recent Inpatient/ED visit.  Follow Up Plan: No further outreach attempts will be made at this time. We have been unable to contact the patient.  Signature Karena Addison, LPN [facility] Nurse Health Advisor Direct Dial [phone]

## 2023-04-23 ENCOUNTER — Ambulatory Visit (INDEPENDENT_AMBULATORY_CARE_PROVIDER_SITE_OTHER): Payer: 59

## 2023-04-23 DIAGNOSIS — R413 Other amnesia: Secondary | ICD-10-CM

## 2023-04-23 DIAGNOSIS — R4589 Other symptoms and signs involving emotional state: Secondary | ICD-10-CM

## 2023-04-23 DIAGNOSIS — R4182 Altered mental status, unspecified: Secondary | ICD-10-CM | POA: Diagnosis not present

## 2023-04-23 DIAGNOSIS — R9089 Other abnormal findings on diagnostic imaging of central nervous system: Secondary | ICD-10-CM

## 2023-04-23 DIAGNOSIS — I6381 Other cerebral infarction due to occlusion or stenosis of small artery: Secondary | ICD-10-CM

## 2023-04-23 DIAGNOSIS — H5703 Miosis: Secondary | ICD-10-CM | POA: Diagnosis not present

## 2023-04-23 DIAGNOSIS — R55 Syncope and collapse: Secondary | ICD-10-CM

## 2023-05-02 ENCOUNTER — Ambulatory Visit: Payer: 59 | Admitting: Physician Assistant

## 2023-05-02 NOTE — Progress Notes (Signed)
Need to schedule a time to discuss MRI results. She had appt scheduled for today and missed it. She does have some MRI changes and things that need to be addressed.

## 2023-05-03 DIAGNOSIS — I6381 Other cerebral infarction due to occlusion or stenosis of small artery: Secondary | ICD-10-CM | POA: Insufficient documentation

## 2023-05-03 DIAGNOSIS — R9389 Abnormal findings on diagnostic imaging of other specified body structures: Secondary | ICD-10-CM | POA: Insufficient documentation

## 2023-05-04 ENCOUNTER — Ambulatory Visit: Payer: 59

## 2023-05-06 ENCOUNTER — Ambulatory Visit (INDEPENDENT_AMBULATORY_CARE_PROVIDER_SITE_OTHER): Payer: 59 | Admitting: Physician Assistant

## 2023-05-06 ENCOUNTER — Encounter: Payer: Self-pay | Admitting: Physician Assistant

## 2023-05-06 VITALS — BP 158/88 | HR 99 | Ht 65.0 in | Wt 188.0 lb

## 2023-05-06 DIAGNOSIS — R7303 Prediabetes: Secondary | ICD-10-CM | POA: Diagnosis not present

## 2023-05-06 DIAGNOSIS — E6609 Other obesity due to excess calories: Secondary | ICD-10-CM

## 2023-05-06 DIAGNOSIS — F411 Generalized anxiety disorder: Secondary | ICD-10-CM

## 2023-05-06 DIAGNOSIS — Z1211 Encounter for screening for malignant neoplasm of colon: Secondary | ICD-10-CM

## 2023-05-06 DIAGNOSIS — E781 Pure hyperglyceridemia: Secondary | ICD-10-CM | POA: Diagnosis not present

## 2023-05-06 DIAGNOSIS — I10 Essential (primary) hypertension: Secondary | ICD-10-CM

## 2023-05-06 DIAGNOSIS — I6381 Other cerebral infarction due to occlusion or stenosis of small artery: Secondary | ICD-10-CM | POA: Diagnosis not present

## 2023-05-06 DIAGNOSIS — R531 Weakness: Secondary | ICD-10-CM

## 2023-05-06 DIAGNOSIS — I2510 Atherosclerotic heart disease of native coronary artery without angina pectoris: Secondary | ICD-10-CM

## 2023-05-06 DIAGNOSIS — F1490 Cocaine use, unspecified, uncomplicated: Secondary | ICD-10-CM | POA: Insufficient documentation

## 2023-05-06 DIAGNOSIS — R55 Syncope and collapse: Secondary | ICD-10-CM | POA: Diagnosis not present

## 2023-05-06 DIAGNOSIS — F5101 Primary insomnia: Secondary | ICD-10-CM | POA: Diagnosis not present

## 2023-05-06 DIAGNOSIS — R930 Abnormal findings on diagnostic imaging of skull and head, not elsewhere classified: Secondary | ICD-10-CM

## 2023-05-06 MED ORDER — TRULICITY 0.75 MG/0.5ML ~~LOC~~ SOAJ
0.7500 mg | SUBCUTANEOUS | 0 refills | Status: DC
Start: 2023-05-06 — End: 2023-08-15

## 2023-05-06 MED ORDER — ZOLPIDEM TARTRATE 10 MG PO TABS
ORAL_TABLET | ORAL | 1 refills | Status: DC
Start: 2023-05-06 — End: 2023-06-24

## 2023-05-06 MED ORDER — AMLODIPINE BESYLATE 10 MG PO TABS
10.0000 mg | ORAL_TABLET | Freq: Every day | ORAL | 0 refills | Status: DC
Start: 2023-05-06 — End: 2023-06-01

## 2023-05-06 MED ORDER — ESCITALOPRAM OXALATE 20 MG PO TABS
ORAL_TABLET | ORAL | 0 refills | Status: DC
Start: 2023-05-06 — End: 2023-06-24

## 2023-05-06 NOTE — Patient Instructions (Addendum)
2 week follow up with nurse for BP check.  Goals:  BP under 130/80 A1C under 6.5 LDL under 70  Will refer to neurology  Stroke Prevention Some medical conditions and behaviors can lead to a higher chance of having a stroke. You can help prevent a stroke by eating healthy, exercising, not smoking, and managing any medical conditions you have. Stroke is a leading cause of functional impairment. Primary prevention is particularly important because a majority of strokes are first-time events. Stroke changes the lives of not only those who experience a stroke but also their family and other caregivers. How can this condition affect me? A stroke is a medical emergency and should be treated right away. A stroke can lead to brain damage and can sometimes be life-threatening. If a person gets medical treatment right away, there is a better chance of surviving and recovering from a stroke. What can increase my risk? The following medical conditions may increase your risk of a stroke: Cardiovascular disease. High blood pressure (hypertension). Diabetes. High cholesterol. Sickle cell disease. Blood clotting disorders (hypercoagulable state). Obesity. Sleep disorders (obstructive sleep apnea). Other risk factors include: Being older than age 49. Having a history of blood clots, stroke, or mini-stroke (transient ischemic attack, TIA). Genetic factors, such as race, ethnicity, or a family history of stroke. Smoking cigarettes or using other tobacco products. Taking birth control pills, especially if you also use tobacco. Heavy use of alcohol or drugs, especially cocaine and methamphetamine. Physical inactivity. What actions can I take to prevent this? Manage your health conditions High cholesterol levels. Eating a healthy diet is important for preventing high cholesterol. If cholesterol cannot be managed through diet alone, you may need to take medicines. Take any prescribed medicines to control  your cholesterol as told by your health care provider. Hypertension. To reduce your risk of stroke, try to keep your blood pressure below 130/80. Eating a healthy diet and exercising regularly are important for controlling blood pressure. If these steps are not enough to manage your blood pressure, you may need to take medicines. Take any prescribed medicines to control hypertension as told by your health care provider. Ask your health care provider if you should monitor your blood pressure at home. Have your blood pressure checked every year, even if your blood pressure is normal. Blood pressure increases with age and some medical conditions. Diabetes. Eating a healthy diet and exercising regularly are important parts of managing your blood sugar (glucose). If your blood sugar cannot be managed through diet and exercise, you may need to take medicines. Take any prescribed medicines to control your diabetes as told by your health care provider. Get evaluated for obstructive sleep apnea. Talk to your health care provider about getting a sleep evaluation if you snore a lot or have excessive sleepiness. Make sure that any other medical conditions you have, such as atrial fibrillation or atherosclerosis, are managed. Nutrition Follow instructions from your health care provider about what to eat or drink to help manage your health condition. These instructions may include: Reducing your daily calorie intake. Limiting how much salt (sodium) you use to 1,500 milligrams (mg) each day. Using only healthy fats for cooking, such as olive oil, canola oil, or sunflower oil. Eating healthy foods. You can do this by: Choosing foods that are high in fiber, such as whole grains, and fresh fruits and vegetables. Eating at least 5 servings of fruits and vegetables a day. Try to fill one-half of your plate with fruits and  vegetables at each meal. Choosing lean protein foods, such as lean cuts of meat, poultry  without skin, fish, tofu, beans, and nuts. Eating low-fat dairy products. Avoiding foods that are high in sodium. This can help lower blood pressure. Avoiding foods that have saturated fat, trans fat, and cholesterol. This can help prevent high cholesterol. Avoiding processed and prepared foods. Counting your daily carbohydrate intake.  Lifestyle If you drink alcohol: Limit how much you have to: 0-1 drink a day for women who are not pregnant. 0-2 drinks a day for men. Know how much alcohol is in your drink. In the [place] one drink equals one 12 oz bottle of beer ( ), one 5 oz glass of wine ( ), or one 1 oz glass of hard liquor (44mL). Do not use any products that contain nicotine or tobacco. These products include cigarettes, chewing tobacco, and vaping devices, such as e-cigarettes. If you need help quitting, ask your health care provider. Avoid secondhand smoke. Do not use drugs. Activity  Try to stay at a healthy weight. Get at least 30 minutes of exercise on most days, such as: Fast walking. Biking. Swimming. Medicines Take over-the-counter and prescription medicines only as told by your health care provider. Aspirin or blood thinners (antiplatelets or anticoagulants) may be recommended to reduce your risk of forming blood clots that can lead to stroke. Avoid taking birth control pills. Talk to your health care provider about the risks of taking birth control pills if: You are over 51 years old. You smoke. You get very bad headaches. You have had a blood clot. Where to find more information American Stroke Association: www.strokeassociation.org Get help right away if: You or a loved one has any symptoms of a stroke. "BE FAST" is an easy way to remember the main warning signs of a stroke: B - Balance. Signs are dizziness, sudden trouble walking, or loss of balance. E - Eyes. Signs are trouble seeing or a sudden change in vision. F - Face. Signs are sudden weakness or  numbness of the face, or the face or eyelid drooping on one side. A - Arms. Signs are weakness or numbness in an arm. This happens suddenly and usually on one side of the body. S - Speech. Signs are sudden trouble speaking, slurred speech, or trouble understanding what people say. T - Time. Time to call emergency services. Write down what time symptoms started. You or a loved one has other signs of a stroke, such as: A sudden, severe headache with no known cause. Nausea or vomiting. Seizure. These symptoms may represent a serious problem that is an emergency. Do not wait to see if the symptoms will go away. Get medical help right away. Call your local emergency services (911 in the [place] Do not drive yourself to the hospital. Summary You can help to prevent a stroke by eating healthy, exercising, not smoking, limiting alcohol intake, and managing any medical conditions you may have. Do not use any products that contain nicotine or tobacco. These include cigarettes, chewing tobacco, and vaping devices, such as e-cigarettes. If you need help quitting, ask your health care provider. Remember "BE FAST" for warning signs of a stroke. Get help right away if you or a loved one has any of these signs. This information is not intended to replace advice given to you by your health care provider. Make sure you discuss any questions you have with your health care provider. Document Revised: 08/30/2022 Document Reviewed: 08/30/2022 Elsevier Patient Education  2024 [employer]

## 2023-05-06 NOTE — Progress Notes (Unsigned)
Established Patient Office Visit  Subjective   Patient ID: MCKINLEIGH BELD, female    DOB: 1971/04/10  Age: 52 y.o. MRN: [id number]  Chief Complaint  Patient presents with   Results    HPI Pt is a 52 yo obese female with hx of migraines, CAD, HTN, GAD, MDD, INsomnia who presents to the clinic to follow up after hospital visit and MRI.   Pt had another episode of syncope on 04/09/2023. She stated she was walking and "just collapsed". She was found to have cocaine in her system and suspect fall could be due to that. She was treated for community acquired pneumonia with possible sepsis with fluids and antibiotics. Flu/RSV/Covid were negative. Troponin was elevated but 2D echo done with no significant findings. CTA negative. Sent home with cefdinir for CAP.   She has not had syncope since then. She is not checking her BP. She denies any CP or palpitations. She did have outpatient MRI which she comes in to discuss results.   She does state she only did cocaine that one time with her son. She also states the positive morphine drug test on 6/24 was after taking morphine from her dad for a migraine.    Active Ambulatory Problems    Diagnosis Date Noted   MIGRAINE HEADACHE 11/03/2010   Insomnia 02/26/2014   Hypertriglyceridemia, essential 02/27/2014   Atherosclerosis of coronary artery 08/02/2014   Fatty liver disease, nonalcoholic 08/02/2014   Adenoma of right adrenal gland 08/02/2014   H. 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/07/2018   Empty sella (HCC) 06/07/2018   Essential hypertension 12/27/2018   Chronic bilateral low back pain without sciatica 11/26/2019   Gastroesophageal reflux disease 11/26/2019   Nausea 11/28/2020   Migraine without aura and without status migrainosus, not intractable 02/24/2021   Seasonal allergies 02/24/2021   Grief 06/08/2021   Viral gastroenteritis 11/20/2021   Allergic sinusitis 12/16/2021   Uncontrolled hypertension 12/21/2021   Dizziness 05/24/2022   Elevated hematocrit 05/25/2022   Elevated hemoglobin (HCC) 05/25/2022   Elevated red blood cell count 05/25/2022   Elevated serum creatinine 05/25/2022   Aortic atherosclerosis (HCC) 05/25/2022   Aorto-iliac atherosclerosis (HCC) 05/26/2022   Pre-diabetes 12/22/2022   Pinpoint pupils noted on examination 04/04/2023   Morbid obesity (HCC) 04/04/2023   Syncope 04/04/2023   Bilateral leg weakness 04/04/2023   Blunted affect 04/05/2023   Left sided lacunar infarction (HCC) 05/03/2023   Abnormal MRI 05/03/2023   Cocaine use 05/06/2023   Hypertension goal BP (blood pressure) < 130/80 05/09/2023   Right sided weakness 05/09/2023   Class 1 obesity due to excess calories with serious comorbidity and body mass index (BMI) of 31.0 to 31.9 in adult 05/09/2023   Hypomagnesemia 05/09/2023   Abnormal MRI of the head 05/09/2023   Resolved Ambulatory Problems    Diagnosis Date Noted   Acute stress reaction 03/12/2016   Muscle spasm of back 03/30/2016   Past Medical History:  Diagnosis Date   Hyperlipidemia      ROS See HPI.    Objective:     BP (!) 158/88 (BP Location: Right Arm, Cuff Size: Normal)   Pulse 99   Ht 5\' 5"  (1.651 m)   Wt 188 lb (85.3 kg)   SpO2 93%   BMI 31.28 kg/m  BP Readings from Last 3 Encounters:  05/06/23 (!) 158/88  04/04/23 107/73  12/22/22 (!) 141/90   Wt Readings from Last 3 Encounters:  05/06/23  188 lb (85.3 kg)  04/04/23 195 lb 0.6 oz (88.5 kg)  12/22/22 203 lb (92.1 kg)     Physical Exam Constitutional:      Appearance: Normal appearance. She is obese.  HENT:     Head: Normocephalic.  Eyes:     Comments: Bruising under bilateral eyes  Cardiovascular:     Rate and Rhythm: Normal rate and regular rhythm.  Pulmonary:     Effort: Pulmonary effort is normal.     Breath sounds: Normal breath sounds.  Musculoskeletal:     Cervical back: Normal range of motion and neck supple.     Right lower leg: No edema.     Left lower leg: No edema.  Neurological:     General: No focal deficit present.     Mental Status: She is oriented to person, place, and time.  Psychiatric:        Mood and Affect: Mood normal.       The 10-year ASCVD risk score (Arnett DK, et al., 2019) is: 3.2%    Assessment & Plan:  Marland KitchenMarland KitchenMorrissa was seen today for results.  Diagnoses and all orders for this visit:  Syncope, unspecified syncope type -     Lipid panel -     CMP14+EGFR -     Magnesium -     TSH -     CBC w/Diff/Platelet -     Ambulatory referral to Neurology  Primary insomnia -     zolpidem (AMBIEN) 10 MG tablet; TAKE ONE TABLET BY MOUTH ONE TIME DAILY AT BEDTIME  Pre-diabetes -     Dulaglutide (TRULICITY) 0.75 MG/0.5ML SOPN; Inject 0.75 mg into the skin once a week. -     Lipid panel -     CMP14+EGFR -     Magnesium -     TSH -     CBC w/Diff/Platelet  Left sided lacunar infarction (HCC) -     Dulaglutide (TRULICITY) 0.75 MG/0.5ML SOPN; Inject 0.75 mg into the skin once a week. -     Lipid panel -     CMP14+EGFR -     Magnesium -     TSH -     CBC w/Diff/Platelet -     Ambulatory referral to Neurology  Cocaine use -     Lipid panel -     CMP14+EGFR -     Magnesium -     TSH -     CBC w/Diff/Platelet  Hypomagnesemia -     Magnesium  Colon cancer screening -     Ambulatory referral to Gastroenterology  Atherosclerosis of native coronary artery of native heart  without angina pectoris -     Dulaglutide (TRULICITY) 0.75 MG/0.5ML SOPN; Inject 0.75 mg into the skin once a week. -     Lipid panel  Hypertriglyceridemia, essential -     Dulaglutide (TRULICITY) 0.75 MG/0.5ML SOPN; Inject 0.75 mg into the skin once a week. -     Lipid panel  GAD (generalized anxiety disorder) -     CMP14+EGFR -     TSH -     escitalopram (LEXAPRO) 20 MG tablet; One half tab daily for a week then one tab by mouth daily  Class 1 obesity due to excess calories with serious comorbidity and body mass index (BMI) of 31.0 to 31.9 in adult -     Dulaglutide (TRULICITY) 0.75 MG/0.5ML SOPN; Inject 0.75 mg into the skin once a week.  Right sided weakness -     Ambulatory referral to Neurology  Hypertension goal BP (blood pressure) < 130/80 -     amLODipine (NORVASC) 10 MG tablet; Take 1 tablet (10 mg total) by mouth daily.  Abnormal MRI of the head -     Ambulatory referral to Neurology   Discussed below MRI:   IMPRESSION: 1. Prominent T2 hyperintensities within the globus pallidus bilaterally. Question previous hypoxic injury. This can be seen in the setting of carbon monoxide toxicity. 2. Anterior periventricular and scattered subcortical T2 hyperintensities are mildly advanced for age. The finding is nonspecific but can be seen in the setting of chronic microvascular ischemia, a demyelinating process such as multiple sclerosis, vasculitis, complicated migraine headaches, or as the sequelae of a prior infectious or inflammatory process. 3. Remote lacunar infarcts of the left thalamus. 4. No acute intracranial abnormality.  Needs neurology referral which was made today Does have hx of left lacunar stroke Discussed prevention with new goals:  BP under 130/80-increased norvasc to 10mg  today and will recheck in 2 weeks A1C under 6.5-sent trulicity to pharmacy to start once weekly injections LDL under 70-lipid ordered today stay on statin Allergy to ASA Will  follow up on magnesium level  Discussed absolutely no drug use! Norco/morphine/cocaine Not prescribing xanax at this time due to abuse of other drugs Increased lexapro to 20mg  for anxiety/depression Continue on ambien for sleep  Spent 50 minutes with patient reviewing history/past visit/labs/imaging as well as discussing plan.   Return in about 4 weeks (around 06/03/2023) for 2 week nurse visit BP.    Tandy Gaw, PA-C

## 2023-05-09 DIAGNOSIS — I10 Essential (primary) hypertension: Secondary | ICD-10-CM | POA: Insufficient documentation

## 2023-05-09 DIAGNOSIS — R531 Weakness: Secondary | ICD-10-CM | POA: Insufficient documentation

## 2023-05-09 DIAGNOSIS — E6609 Other obesity due to excess calories: Secondary | ICD-10-CM | POA: Insufficient documentation

## 2023-05-09 DIAGNOSIS — R930 Abnormal findings on diagnostic imaging of skull and head, not elsewhere classified: Secondary | ICD-10-CM | POA: Insufficient documentation

## 2023-05-09 DIAGNOSIS — E66811 Obesity, class 1: Secondary | ICD-10-CM | POA: Insufficient documentation

## 2023-05-17 ENCOUNTER — Telehealth: Payer: Self-pay | Admitting: Physician Assistant

## 2023-05-17 NOTE — Telephone Encounter (Signed)
Patient called in for a doctor's note from her OV on 7/26. States she needs it to return back to work. Please advise.

## 2023-05-18 ENCOUNTER — Encounter: Payer: Self-pay | Admitting: Physician Assistant

## 2023-05-18 NOTE — Telephone Encounter (Signed)
OK for note to return back to work

## 2023-05-18 NOTE — Telephone Encounter (Signed)
Note written and placed at front desk for pick up

## 2023-05-20 ENCOUNTER — Ambulatory Visit: Payer: 59

## 2023-06-01 ENCOUNTER — Other Ambulatory Visit: Payer: Self-pay | Admitting: Physician Assistant

## 2023-06-01 DIAGNOSIS — I10 Essential (primary) hypertension: Secondary | ICD-10-CM

## 2023-06-03 ENCOUNTER — Ambulatory Visit: Payer: 59 | Admitting: Physician Assistant

## 2023-06-10 ENCOUNTER — Ambulatory Visit: Payer: 59 | Admitting: Physician Assistant

## 2023-06-17 ENCOUNTER — Ambulatory Visit: Payer: 59 | Admitting: Physician Assistant

## 2023-06-21 ENCOUNTER — Other Ambulatory Visit: Payer: Self-pay | Admitting: Physician Assistant

## 2023-06-24 ENCOUNTER — Telehealth: Payer: Self-pay

## 2023-06-24 ENCOUNTER — Ambulatory Visit (INDEPENDENT_AMBULATORY_CARE_PROVIDER_SITE_OTHER): Payer: 59 | Admitting: Physician Assistant

## 2023-06-24 ENCOUNTER — Other Ambulatory Visit: Payer: Self-pay | Admitting: Physician Assistant

## 2023-06-24 ENCOUNTER — Encounter: Payer: Self-pay | Admitting: Physician Assistant

## 2023-06-24 VITALS — BP 144/84 | HR 75 | Ht 60.0 in | Wt 188.0 lb

## 2023-06-24 DIAGNOSIS — F411 Generalized anxiety disorder: Secondary | ICD-10-CM | POA: Diagnosis not present

## 2023-06-24 DIAGNOSIS — K219 Gastro-esophageal reflux disease without esophagitis: Secondary | ICD-10-CM | POA: Diagnosis not present

## 2023-06-24 DIAGNOSIS — I2510 Atherosclerotic heart disease of native coronary artery without angina pectoris: Secondary | ICD-10-CM

## 2023-06-24 DIAGNOSIS — I10 Essential (primary) hypertension: Secondary | ICD-10-CM

## 2023-06-24 DIAGNOSIS — F5101 Primary insomnia: Secondary | ICD-10-CM | POA: Diagnosis not present

## 2023-06-24 DIAGNOSIS — R519 Headache, unspecified: Secondary | ICD-10-CM | POA: Diagnosis not present

## 2023-06-24 MED ORDER — AMLODIPINE BESYLATE 10 MG PO TABS
10.0000 mg | ORAL_TABLET | Freq: Every day | ORAL | 3 refills | Status: DC
Start: 2023-06-24 — End: 2023-12-23

## 2023-06-24 MED ORDER — GABAPENTIN 100 MG PO CAPS: ORAL_CAPSULE | ORAL | 1 refills | Status: DC

## 2023-06-24 MED ORDER — ATORVASTATIN CALCIUM 40 MG PO TABS
40.0000 mg | ORAL_TABLET | Freq: Every day | ORAL | 3 refills | Status: DC
Start: 2023-06-24 — End: 2023-12-23

## 2023-06-24 MED ORDER — TIZANIDINE HCL 4 MG PO TABS
ORAL_TABLET | ORAL | 1 refills | Status: DC
Start: 2023-06-24 — End: 2023-07-06

## 2023-06-24 MED ORDER — PANTOPRAZOLE SODIUM 40 MG PO TBEC: 40.0000 mg | DELAYED_RELEASE_TABLET | Freq: Every day | ORAL | 3 refills | Status: DC

## 2023-06-24 MED ORDER — ESCITALOPRAM OXALATE 20 MG PO TABS: ORAL_TABLET | ORAL | 1 refills | Status: DC

## 2023-06-24 MED ORDER — ZOLPIDEM TARTRATE 10 MG PO TABS
ORAL_TABLET | ORAL | 1 refills | Status: DC
Start: 2023-06-24 — End: 2023-11-18

## 2023-06-24 NOTE — Telephone Encounter (Addendum)
Initiated Prior authorization [id number] 0.75MG /0.5ML pen-injectors Via: Covermymeds Case/Key:[id number] Status: approved  as of 06/24/23 Reason:Authorization Expiration Date: 06/22/2024  Notified Pt via: Mychart

## 2023-06-24 NOTE — Patient Instructions (Signed)
Tension Headache, Adult A tension headache is a feeling of pain, pressure, or aching over the front and sides of the head. The pain can be dull, or it can feel tight. There are two types of tension headache: Episodic tension headache. This is when the headaches happen fewer than 15 days a month. Chronic tension headache. This is when the headaches happen more than 15 days a month during a 86-month period. A tension headache can last from 30 minutes to several days. It is the most common kind of headache. Tension headaches are not normally associated with nausea or vomiting, and they do not get worse with physical activity. What are the causes? The exact cause of this condition is not known. Tension headaches are often triggered by stress, anxiety, or depression. Other triggers may include: Alcohol. Too much caffeine or caffeine withdrawal. Respiratory infections, such as colds, flu, or sinus infections. Dental problems or teeth clenching. Fatigue. Holding your head and neck in the same position for a long period of time, such as while using a computer. Smoking. Arthritis of the neck. What are the signs or symptoms? Symptoms of this condition include: A feeling of pressure or tightness around the head. Dull, aching head pain. Pain over the front and sides of the head. Tenderness in the muscles of the head, neck, and shoulders. How is this diagnosed? This condition may be diagnosed based on your symptoms, your medical history, and a physical exam. If your symptoms are severe or unusual, you may have imaging tests, such as a CT scan or an MRI of your head. Your vision may also be checked. How is this treated? This condition may be treated with lifestyle changes and with medicines that help relieve symptoms. Follow these instructions at home: Managing pain Take over-the-counter and prescription medicines only as told by your health care provider. When you have a headache, lie down in a dark,  quiet room. If directed, put ice on your head and neck. To do this: Put ice in a plastic bag. Place a towel between your skin and the bag. Leave the ice on for 20 minutes, 2-3 times a day. Remove the ice if your skin turns bright red. This is very important. If you cannot feel pain, heat, or cold, you have a greater risk of damage to the area. If directed, apply heat to the back of your neck as often as told by your health care provider. Use the heat source that your health care provider recommends, such as a moist heat pack or a heating pad. Place a towel between your skin and the heat source. Leave the heat on for 20-30 minutes. Remove the heat if your skin turns bright red. This is especially important if you are unable to feel pain, heat, or cold. You have a greater risk of getting burned. Eating and drinking Eat meals on a regular schedule. If you drink alcohol: Limit how much you have to: 0-1 drink a day for women who are not pregnant. 0-2 drinks a day for men. Know how much alcohol is in your drink. In the [place] one drink equals one 12 oz bottle of beer (355 mL), one 5 oz glass of wine (148 mL), or one 1 oz glass of hard liquor (44 mL). Drink enough fluid to keep your urine pale yellow. Decrease your caffeine intake, or stop using caffeine. Lifestyle Get 7-9 hours of sleep each night, or get the amount of sleep recommended by your health care provider. At bedtime,  remove computers, phones, and tablets from your room. Find ways to manage your stress. This may include: Exercise. Deep breathing exercises. Yoga. Listening to music. Positive mental imagery. Try to sit up straight and avoid tensing your muscles. Do not use any products that contain nicotine or tobacco. These include cigarettes, chewing tobacco, and vaping devices, such as e-cigarettes. If you need help quitting, ask your health care provider. General instructions  Avoid any headache triggers. Keep a journal to help  find out what may trigger your headaches. For example, write down: What you eat and drink. How much sleep you get. Any change to your diet or medicines. Keep all follow-up visits. This is important. Contact a health care provider if: Your headache does not get better. Your headache comes back. You are sensitive to sounds, light, or smells because of a headache. You have nausea or you vomit. Your stomach hurts. Get help right away if: You suddenly develop a severe headache, along with any of the following: A stiff neck. Nausea and vomiting. Confusion. Weakness in one part or one side of your body. Double vision or loss of vision. Shortness of breath. Rash. Unusual sleepiness. Fever or chills. Trouble speaking. Pain in your eye or ear. Trouble walking or balancing. Feeling faint or passing out. Summary A tension headache is a feeling of pain, pressure, or aching over the front and sides of the head. A tension headache can last from 30 minutes to several days. It is the most common kind of headache. This condition may be diagnosed based on your symptoms, your medical history, and a physical exam. This condition may be treated with lifestyle changes and with medicines that help relieve symptoms. This information is not intended to replace advice given to you by your health care provider. Make sure you discuss any questions you have with your health care provider. Document Revised: 06/26/2020 Document Reviewed: 06/26/2020 Elsevier Patient Education  2024 [employer].

## 2023-06-24 NOTE — Progress Notes (Unsigned)
Established Patient Office Visit  Subjective   Patient ID: Janice [name], female    DOB: October 23, 1970  Age: 52 y.o. MRN: [id number]  Chief Complaint  Patient presents with   Medical Management of Chronic Issues    HPI Pt is a 52 yo obese female who presents to the clinic to follow up on medications and discuss new headache for the last 2 weeks. HA is more frontal but wraps around entire head. BP at home in the 140s over 75s. She denies any balance issues, CP, palpitations, vision changes, light/sound sensitivities, nausea or vomiting. Recent MRI of head.   She has not been able to get trulicity filled yet.  She is not on CPAP. Request ambien refill for sleep.   Her memory is better. She has not seen neurology yet. Doing well with mood. Working. No drug use.   .. Active Ambulatory Problems    Diagnosis Date Noted   MIGRAINE HEADACHE 11/03/2010   Insomnia 02/26/2014   Hypertriglyceridemia, essential 02/27/2014   Atherosclerosis of coronary artery 08/02/2014   Fatty liver disease, nonalcoholic 08/02/2014   Adenoma of right adrenal gland 08/02/2014   H. 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   Empty sella (HCC) 06/07/2018   Essential hypertension 12/27/2018   Chronic bilateral low back pain without sciatica 11/26/2019   Gastroesophageal reflux disease 11/26/2019   Nausea 11/28/2020   Migraine without aura and without  status migrainosus, not intractable 02/24/2021   Seasonal allergies 02/24/2021   Grief 06/08/2021   Viral gastroenteritis 11/20/2021   Allergic sinusitis 12/16/2021   Uncontrolled hypertension 12/21/2021   Dizziness 05/24/2022   Elevated hematocrit 05/25/2022   Elevated hemoglobin (HCC) 05/25/2022   Elevated red blood cell count 05/25/2022   Elevated serum creatinine 05/25/2022   Aortic atherosclerosis (HCC) 05/25/2022   Aorto-iliac atherosclerosis (HCC) 05/26/2022   Pre-diabetes 12/22/2022   Pinpoint pupils noted on examination 04/04/2023   Morbid obesity (HCC) 04/04/2023   Syncope 04/04/2023   Bilateral leg weakness 04/04/2023   Blunted affect 04/05/2023   Left sided lacunar infarction (HCC) 05/03/2023   Abnormal MRI 05/03/2023   Cocaine use 05/06/2023   Hypertension goal BP (blood pressure) < 130/80 05/09/2023   Right sided weakness 05/09/2023   Class 1 obesity due to excess calories with serious comorbidity and body mass index (BMI) of 31.0 to 31.9 in adult 05/09/2023   Hypomagnesemia 05/09/2023   Abnormal MRI of the head 05/09/2023   Frontal headache 06/24/2023   Resolved Ambulatory Problems    Diagnosis Date Noted   Acute stress reaction 03/12/2016   Muscle spasm of back 03/30/2016   Past Medical History:  Diagnosis Date   Hyperlipidemia      ROS See HPI.    Objective:     BP (!) 144/84   Pulse 75   Ht 5' (1.524 m)   Wt 188 lb (85.3 kg)   SpO2 99%   BMI 36.72 kg/m  BP Readings from Last  3 Encounters:  06/24/23 (!) 144/84  05/06/23 (!) 158/88  04/04/23 107/73   Wt Readings from Last 3 Encounters:  06/24/23 188 lb (85.3 kg)  05/06/23 188 lb (85.3 kg)  04/04/23 195 lb 0.6 oz (88.5 kg)    ..    04/04/2023    2:52 PM 04/04/2023    2:47 PM 05/24/2022   11:58 AM 12/16/2021   11:33 AM 07/09/2021    3:43 PM  Depression screen PHQ 2/9  Decreased Interest 0 1 1 0 1  Down, Depressed, Hopeless 0 1 1 0 1  PHQ - 2 Score 0 2 2 0 2  Altered sleeping 1  1  3    Tired, decreased energy 2  1  1   Change in appetite 1  1  1   Feeling bad or failure about yourself  1  1  1   Trouble concentrating 1  1  1   Moving slowly or fidgety/restless 1  1  0  Suicidal thoughts 0  0  0  PHQ-9 Score 7  8  9   Difficult doing work/chores Not difficult at all  Somewhat difficult  Not difficult at all   ..    04/04/2023    2:52 PM 05/24/2022   11:58 AM 07/09/2021    3:43 PM 11/24/2020    3:49 PM  GAD 7 : Generalized Anxiety Score  Nervous, Anxious, on Edge 0 1 1 2   Control/stop worrying 0 1 0 1  Worry too much - different things 0 1 1 1   Trouble relaxing 1 1 1 1   Restless 0 1 0 1  Easily annoyed or irritable 1 1 0 1  Afraid - awful might happen 1 1 0 0  Total GAD 7 Score 3 7 3 7   Anxiety Difficulty Somewhat difficult Somewhat difficult Not difficult at all       Physical Exam Constitutional:      Appearance: Normal appearance. She is obese.  Neck:     Vascular: No carotid bruit.  Cardiovascular:     Rate and Rhythm: Normal rate and regular rhythm.     Pulses: Normal pulses.     Heart sounds: Normal heart sounds.  Pulmonary:     Effort: Pulmonary effort is normal.     Breath sounds: Normal breath sounds.  Musculoskeletal:     Cervical back: Neck supple.     Right lower leg: No edema.     Left lower leg: No edema.  Lymphadenopathy:     Cervical: No cervical adenopathy.  Neurological:     General: No focal deficit present.     Mental Status: She is alert and oriented to person, place, and time.  Psychiatric:        Mood and Affect: Mood normal.      The 10-year ASCVD risk score (Arnett DK, et al., 2019) is: 2.9%    Assessment & Plan:  Marland KitchenMarland KitchenTalyiah was seen today for medical management of chronic issues.  Diagnoses and all orders for this visit:  Frontal headache -     tiZANidine (ZANAFLEX) 4 MG tablet; Twice a day as needed. -     gabapentin (NEURONTIN) 100 MG capsule; One tab PO qHS for a week, then BID for a week, then TID.  Primary  insomnia -     zolpidem (AMBIEN) 10 MG tablet; TAKE ONE TABLET BY MOUTH ONE TIME DAILY AT BEDTIME  GAD (generalized anxiety disorder) -     escitalopram (LEXAPRO) 20 MG tablet; Take one tablet daily.  Atherosclerosis of native coronary artery of native heart without angina pectoris -     atorvastatin (LIPITOR) 40 MG tablet; Take 1 tablet (40 mg total) by mouth daily.  Hypertension goal BP (blood pressure) < 130/80 -     amLODipine (NORVASC) 10 MG tablet; Take 1 tablet (10 mg total) by mouth daily.  Gastroesophageal reflux disease, unspecified whether esophagitis present -     pantoprazole (PROTONIX) 40 MG tablet; Take 1 tablet (40 mg total) by mouth daily.   BP not to goal, unsure if took medication today.  Refilled norvasc Continue to monitor at home Start using CPaP will help with BP control Recheck in 2-4 weeks  GERD-refilled protonix  MDD-refilled lexapro  Start zanaflex and gabapentin for likely tension headache Discussed massages and good cervical pillow Consider magnesium at bedtime Follow up in 2-4 weeks or as needed     Return in about 4 weeks (around 07/22/2023) for BP and headache.    Tandy Gaw, PA-C

## 2023-06-28 ENCOUNTER — Other Ambulatory Visit: Payer: Self-pay | Admitting: Physician Assistant

## 2023-06-28 DIAGNOSIS — I10 Essential (primary) hypertension: Secondary | ICD-10-CM

## 2023-07-06 ENCOUNTER — Telehealth: Payer: Self-pay | Admitting: *Deleted

## 2023-07-06 NOTE — Telephone Encounter (Signed)
LVM for pt to return call to inform her that she has been approved for coverage of Trulicity.  The request is approved from 06/24/23-06/23/24 as long as you remain covered by your prescription plan.  Will also send a mychart message informing pt of this. Sent approval to scan center.

## 2023-07-08 ENCOUNTER — Other Ambulatory Visit: Payer: Self-pay | Admitting: Physician Assistant

## 2023-07-29 ENCOUNTER — Ambulatory Visit: Payer: 59 | Admitting: Physician Assistant

## 2023-08-15 ENCOUNTER — Other Ambulatory Visit: Payer: Self-pay | Admitting: Physician Assistant

## 2023-08-15 DIAGNOSIS — R7303 Prediabetes: Secondary | ICD-10-CM

## 2023-08-15 DIAGNOSIS — I2510 Atherosclerotic heart disease of native coronary artery without angina pectoris: Secondary | ICD-10-CM

## 2023-08-15 DIAGNOSIS — R110 Nausea: Secondary | ICD-10-CM

## 2023-08-15 DIAGNOSIS — I6381 Other cerebral infarction due to occlusion or stenosis of small artery: Secondary | ICD-10-CM

## 2023-08-15 DIAGNOSIS — E781 Pure hyperglyceridemia: Secondary | ICD-10-CM

## 2023-08-15 DIAGNOSIS — E66811 Obesity, class 1: Secondary | ICD-10-CM

## 2023-08-15 DIAGNOSIS — R112 Nausea with vomiting, unspecified: Secondary | ICD-10-CM

## 2023-08-15 MED ORDER — TIZANIDINE HCL 4 MG PO TABS: ORAL_TABLET | ORAL | 0 refills | Status: DC

## 2023-08-15 MED ORDER — TRULICITY 0.75 MG/0.5ML ~~LOC~~ SOAJ: 0.7500 mg | SUBCUTANEOUS | 0 refills | Status: DC

## 2023-08-15 MED ORDER — PROMETHAZINE HCL 25 MG PO TABS: 25.0000 mg | ORAL_TABLET | Freq: Four times a day (QID) | ORAL | 0 refills | Status: DC | PRN

## 2023-08-23 ENCOUNTER — Ambulatory Visit: Payer: Self-pay | Admitting: Diagnostic Neuroimaging

## 2023-08-23 ENCOUNTER — Ambulatory Visit: Payer: 59 | Admitting: Physician Assistant

## 2023-09-02 ENCOUNTER — Ambulatory Visit: Payer: 59 | Admitting: Physician Assistant

## 2023-09-05 ENCOUNTER — Ambulatory Visit: Payer: 59 | Admitting: Physician Assistant

## 2023-09-11 ENCOUNTER — Other Ambulatory Visit: Payer: Self-pay | Admitting: Physician Assistant

## 2023-09-11 DIAGNOSIS — R519 Headache, unspecified: Secondary | ICD-10-CM

## 2023-09-15 ENCOUNTER — Other Ambulatory Visit: Payer: Self-pay | Admitting: Physician Assistant

## 2023-09-16 ENCOUNTER — Ambulatory Visit: Payer: 59 | Admitting: Physician Assistant

## 2023-09-21 ENCOUNTER — Ambulatory Visit: Payer: 59 | Admitting: Physician Assistant

## 2023-09-29 ENCOUNTER — Other Ambulatory Visit: Payer: Self-pay | Admitting: Physician Assistant

## 2023-09-29 DIAGNOSIS — E781 Pure hyperglyceridemia: Secondary | ICD-10-CM

## 2023-09-29 DIAGNOSIS — I6381 Other cerebral infarction due to occlusion or stenosis of small artery: Secondary | ICD-10-CM

## 2023-09-29 DIAGNOSIS — E6609 Other obesity due to excess calories: Secondary | ICD-10-CM

## 2023-09-29 DIAGNOSIS — I2510 Atherosclerotic heart disease of native coronary artery without angina pectoris: Secondary | ICD-10-CM

## 2023-09-29 DIAGNOSIS — R7303 Prediabetes: Secondary | ICD-10-CM

## 2023-10-03 ENCOUNTER — Ambulatory Visit: Payer: 59 | Admitting: Physician Assistant

## 2023-10-04 ENCOUNTER — Telehealth: Payer: 59

## 2023-10-04 DIAGNOSIS — J069 Acute upper respiratory infection, unspecified: Secondary | ICD-10-CM | POA: Diagnosis not present

## 2023-10-04 DIAGNOSIS — B9689 Other specified bacterial agents as the cause of diseases classified elsewhere: Secondary | ICD-10-CM

## 2023-10-04 MED ORDER — BENZONATATE 100 MG PO CAPS: 100.0000 mg | ORAL_CAPSULE | Freq: Three times a day (TID) | ORAL | 0 refills | Status: DC | PRN

## 2023-10-04 MED ORDER — AMOXICILLIN-POT CLAVULANATE 875-125 MG PO TABS: 1.0000 | ORAL_TABLET | Freq: Two times a day (BID) | ORAL | 0 refills | Status: DC

## 2023-10-04 MED ORDER — PSEUDOEPH-BROMPHEN-DM 30-2-10 MG/5ML PO SYRP: 5.0000 mL | ORAL_SOLUTION | Freq: Four times a day (QID) | ORAL | 0 refills | Status: DC | PRN

## 2023-10-04 MED ORDER — ALBUTEROL SULFATE HFA 108 (90 BASE) MCG/ACT IN AERS: 1.0000 | INHALATION_SPRAY | Freq: Four times a day (QID) | RESPIRATORY_TRACT | 0 refills | Status: DC | PRN

## 2023-10-04 NOTE — Patient Instructions (Signed)
Janice [name], thank you for joining Janice Loveless, PA-C for today's virtual visit.  While this provider is not your primary care provider (PCP), if your PCP is located in our provider database this encounter information will be shared with them immediately following your visit.   A Janice [name] MyChart account gives you access to today's visit and all your visits, tests, and labs performed at [facility] " click here if you don't have a [facility] MyChart account or go to [email or web address]  Consent: (Patient) Janice [name] provided verbal consent for this virtual visit at the beginning of the encounter.  Current Medications:  Current Outpatient Medications:    albuterol (VENTOLIN HFA) 108 (90 Base) MCG/ACT inhaler, Inhale 1-2 puffs into the lungs every 6 (six) hours as needed., Disp: 8 g, Rfl: 0   amoxicillin-clavulanate (AUGMENTIN) 875-125 MG tablet, Take 1 tablet by mouth 2 (two) times daily., Disp: 20 tablet, Rfl: 0   benzonatate (TESSALON) 100 MG capsule, Take 1-2 capsules (100-200 mg total) by mouth 3 (three) times daily as needed., Disp: 30 capsule, Rfl: 0   brompheniramine-pseudoephedrine-DM 30-2-10 MG/5ML syrup, Take 5 mLs by mouth 4 (four) times daily as needed., Disp: 120 mL, Rfl: 0   ALPRAZolam (XANAX) 0.5 MG tablet, TAKE 1 TABLET BY MOUTH UP TO TWICE A DAY AS NEEDED FOR ANXIETY, Disp: 60 tablet, Rfl: 1   amLODipine (NORVASC) 10 MG tablet, Take 1 tablet (10 mg total) by mouth daily., Disp: 90 tablet, Rfl: 3   atorvastatin (LIPITOR) 40 MG tablet, Take 1 tablet (40 mg total) by mouth daily., Disp: 90 tablet, Rfl: 3   buPROPion (WELLBUTRIN XL) 300 MG 24 hr tablet, Take 1 tablet (300 mg total) by mouth daily., Disp: 90 tablet, Rfl: 1   Dulaglutide (TRULICITY) 0.75 MG/0.5ML SOAJ, INJECT 0.75 MG SUBCUTANEOUSLY ONE TIME PER WEEK, Disp: 2 mL, Rfl: 0   escitalopram (LEXAPRO) 20 MG tablet, Take one tablet daily., Disp: 90 tablet, Rfl: 1   gabapentin (NEURONTIN)  100 MG capsule, TAKE 1 CAPSULE BY MOUTH AT BEDTIME FOR 1 WK THEN TWICE A DAY FOR 1 WEEK THEN 3 TIMES A DAY, Disp: 90 capsule, Rfl: 1   lisinopril-hydrochlorothiazide (ZESTORETIC) 20-25 MG tablet, Take 2 tablets by mouth daily., Disp: 180 tablet, Rfl: 1   pantoprazole (PROTONIX) 40 MG tablet, Take 1 tablet (40 mg total) by mouth daily., Disp: 90 tablet, Rfl: 3   promethazine (PHENERGAN) 25 MG tablet, Take 1 tablet (25 mg total) by mouth every 6 (six) hours as needed for nausea or vomiting., Disp: 30 tablet, Rfl: 0   tiZANidine (ZANAFLEX) 4 MG tablet, One tablet , twice daily as needed, Disp: 180 tablet, Rfl: 0   zolpidem (AMBIEN) 10 MG tablet, TAKE ONE TABLET BY MOUTH ONE TIME DAILY AT BEDTIME, Disp: 90 tablet, Rfl: 1   Medications ordered in this encounter:  Meds ordered this encounter  Medications   amoxicillin-clavulanate (AUGMENTIN) 875-125 MG tablet    Sig: Take 1 tablet by mouth 2 (two) times daily.    Dispense:  20 tablet    Refill:  0    Supervising Provider:   Merrilee [name] [[id number]]   brompheniramine-pseudoephedrine-DM 30-2-10 MG/5ML syrup    Sig: Take 5 mLs by mouth 4 (four) times daily as needed.    Dispense:  120 mL    Refill:  0    Supervising Provider:   Merrilee [name] [[id number]]   benzonatate (TESSALON) 100 MG capsule    Sig: Take 1-2 capsules (  100-200 mg total) by mouth 3 (three) times daily as needed.    Dispense:  30 capsule    Refill:  0    Supervising Provider:   Merrilee [name] [[id number]]   albuterol (VENTOLIN HFA) 108 (90 Base) MCG/ACT inhaler    Sig: Inhale 1-2 puffs into the lungs every 6 (six) hours as needed.    Dispense:  8 g    Refill:  0    Supervising Provider:   Merrilee [name] [[id number]]     *If you need refills on other medications prior to your next appointment, please contact your pharmacy*  Follow-Up: Call back or seek an in-person evaluation if the symptoms worsen or if the condition fails to improve as anticipated.  [facility]  Virtual Care [phone]  Other Instructions  Upper Respiratory Infection, Adult An upper respiratory infection (URI) is a common viral infection of the nose, throat, and upper air passages that lead to the lungs. The most common type of URI is the common cold. URIs usually get better on their own, without medical treatment. What are the causes? A URI is caused by a virus. You may catch a virus by: Breathing in droplets from an infected person's cough or sneeze. Touching something that has been exposed to the virus (is contaminated) and then touching your mouth, nose, or eyes. What increases the risk? You are more likely to get a URI if: You are very young or very old. You have close contact with others, such as at work, school, or a health care facility. You smoke. You have long-term (chronic) heart or lung disease. You have a weakened disease-fighting system (immune system). You have nasal allergies or asthma. You are experiencing a lot of stress. You have poor nutrition. What are the signs or symptoms? A URI usually involves some of the following symptoms: Runny or stuffy (congested) nose. Cough. Sneezing. Sore throat. Headache. Fatigue. Fever. Loss of appetite. Pain in your forehead, behind your eyes, and over your cheekbones (sinus pain). Muscle aches. Redness or irritation of the eyes. Pressure in the ears or face. How is this diagnosed? This condition may be diagnosed based on your medical history and symptoms, and a physical exam. Your health care provider may use a swab to take a mucus sample from your nose (nasal swab). This sample can be tested to determine what virus is causing the illness. How is this treated? URIs usually get better on their own within 7-10 days. Medicines cannot cure URIs, but your health care provider may recommend certain medicines to help relieve symptoms, such as: Over-the-counter cold medicines. Cough suppressants. Coughing is a type of  defense against infection that helps to clear the respiratory system, so take these medicines only as recommended by your health care provider. Fever-reducing medicines. Follow these instructions at home: Activity Rest as needed. If you have a fever, stay home from work or school until your fever is gone or until your health care provider says your URI cannot spread to other people (is no longer contagious). Your health care provider may have you wear a face mask to prevent your infection from spreading. Relieving symptoms Gargle with a mixture of salt and water 3-4 times a day or as needed. To make salt water, completely dissolve -1 tsp (3-6 g) of salt in 1 cup (237 mL) of warm water. Use a cool-mist humidifier to add moisture to the air. This can help you breathe more easily. Eating and drinking  Drink enough fluid  to keep your urine pale yellow. Eat soups and other clear broths. General instructions  Take over-the-counter and prescription medicines only as told by your health care provider. These include cold medicines, fever reducers, and cough suppressants. Do not use any products that contain nicotine or tobacco. These products include cigarettes, chewing tobacco, and vaping devices, such as e-cigarettes. If you need help quitting, ask your health care provider. Stay away from secondhand smoke. Stay up to date on all immunizations, including the yearly (annual) flu vaccine. Keep all follow-up visits. This is important. How to prevent the spread of infection to others URIs can be contagious. To prevent the infection from spreading: Wash your hands with soap and water for at least 20 seconds. If soap and water are not available, use hand sanitizer. Avoid touching your mouth, face, eyes, or nose. Cough or sneeze into a tissue or your sleeve or elbow instead of into your hand or into the air.  Contact a health care provider if: You are getting worse instead of better. You have a fever  or chills. Your mucus is brown or red. You have yellow or brown discharge coming from your nose. You have pain in your face, especially when you bend forward. You have swollen neck glands. You have pain while swallowing. You have white areas in the back of your throat. Get help right away if: You have shortness of breath that gets worse. You have severe or persistent: Headache. Ear pain. Sinus pain. Chest pain. You have chronic lung disease along with any of the following: Making high-pitched whistling sounds when you breathe, most often when you breathe out (wheezing). Prolonged cough (more than 14 days). Coughing up blood. A change in your usual mucus. You have a stiff neck. You have changes in your: Vision. Hearing. Thinking. Mood. These symptoms may be an emergency. Get help right away. Call 911. Do not wait to see if the symptoms will go away. Do not drive yourself to the hospital. Summary An upper respiratory infection (URI) is a common infection of the nose, throat, and upper air passages that lead to the lungs. A URI is caused by a virus. URIs usually get better on their own within 7-10 days. Medicines cannot cure URIs, but your health care provider may recommend certain medicines to help relieve symptoms. This information is not intended to replace advice given to you by your health care provider. Make sure you discuss any questions you have with your health care provider. Document Revised: 04/29/2021 Document Reviewed: 04/29/2021 Elsevier Patient Education  2024 Elsevier Inc.    If you have been instructed to have an in-person evaluation today at a local Urgent Care facility, please use the link below. It will take you to a list of all of our available [facility], including address, phone number and hours of operation. Please do not delay care.  [facility]  If you or a family member do not have a primary care provider, use the link  below to schedule a visit and establish care. When you choose a [facility] primary care physician or advanced practice provider, you gain a long-term partner in health. Find a Primary Care Provider  Learn more about [facility]'s in-office and virtual care options: [facility] - Get Care Now

## 2023-10-04 NOTE — Progress Notes (Signed)
Virtual Visit Consent   Janice [name], you are scheduled for a virtual visit with a [facility] provider today. Just as with appointments in the office, your consent must be obtained to participate. Your consent will be active for this visit and any virtual visit you may have with one of our providers in the next 365 days. If you have a MyChart account, a copy of this consent can be sent to you electronically.  As this is a virtual visit, video technology does not allow for your provider to perform a traditional examination. This may limit your provider's ability to fully assess your condition. If your provider identifies any concerns that need to be evaluated in person or the need to arrange testing (such as labs, EKG, etc.), we will make arrangements to do so. Although advances in technology are sophisticated, we cannot ensure that it will always work on either your end or our end. If the connection with a video visit is poor, the visit may have to be switched to a telephone visit. With either a video or telephone visit, we are not always able to ensure that we have a secure connection.  By engaging in this virtual visit, you consent to the provision of healthcare and authorize for your insurance to be billed (if applicable) for the services provided during this visit. Depending on your insurance coverage, you may receive a charge related to this service.  I need to obtain your verbal consent now. Are you willing to proceed with your visit today? Janice [name] has provided verbal consent on 10/04/2023 for a virtual visit (video or telephone). [name], PA-C  Date: 10/04/2023 11:25 AM  Virtual Visit via Video Note   I, [name], connected with  Janice [name]  ([id number], 08-08-1971) on 10/04/23 at 11:15 AM EST by a video-enabled telemedicine application and verified that I am speaking with the correct person using two identifiers.  Location: Patient: Virtual Visit  Location Patient: Home Provider: Virtual Visit Location Provider: Home Office   I discussed the limitations of evaluation and management by telemedicine and the availability of in person appointments. The patient expressed understanding and agreed to proceed.    History of Present Illness: Janice [name] is a 52 y.o. who identifies as a female who was assigned female at birth, and is being seen today for cough and congestion.  HPI: URI  This is a new problem. The current episode started 1 to 4 weeks ago. The problem has been gradually worsening. There has been no fever. Associated symptoms include congestion, coughing, headaches, nausea, a plugged ear sensation (right), rhinorrhea, sinus pain, a sore throat (started today), vomiting (with coughing) and wheezing (mild). Pertinent negatives include no chest pain, diarrhea or ear pain. Treatments tried: tylenol, ibuprofen. The treatment provided no relief.     Problems:  Patient Active Problem List   Diagnosis Date Noted   Frontal headache 06/24/2023   Hypertension goal BP (blood pressure) < 130/80 05/09/2023   Right sided weakness 05/09/2023   Class 1 obesity due to excess calories with serious comorbidity and body mass index (BMI) of 31.0 to 31.9 in adult 05/09/2023   Hypomagnesemia 05/09/2023   Abnormal MRI of the head 05/09/2023   Cocaine use 05/06/2023   Left sided lacunar infarction (HCC) 05/03/2023   Abnormal MRI 05/03/2023   Blunted affect 04/05/2023   Pinpoint pupils noted on examination 04/04/2023   Morbid obesity (HCC) 04/04/2023   Syncope 04/04/2023   Bilateral  leg weakness 04/04/2023   Pre-diabetes 12/22/2022   Aorto-iliac atherosclerosis (HCC) 05/26/2022   Elevated hematocrit 05/25/2022   Elevated hemoglobin (HCC) 05/25/2022   Elevated red blood cell count 05/25/2022   Elevated serum creatinine 05/25/2022   Aortic atherosclerosis (HCC) 05/25/2022   Dizziness 05/24/2022   Uncontrolled hypertension 12/21/2021    Allergic sinusitis 12/16/2021   Viral gastroenteritis 11/20/2021   Grief 06/08/2021   Migraine without aura and without status migrainosus, not intractable 02/24/2021   Seasonal allergies 02/24/2021   Nausea 11/28/2020   Chronic bilateral low back pain without sciatica 11/26/2019   Gastroesophageal reflux disease 11/26/2019   Essential hypertension 12/27/2018   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 pylori infection 08/05/2014   Atherosclerosis of coronary artery 08/02/2014   Fatty liver disease, nonalcoholic 08/02/2014   Adenoma of right adrenal gland 08/02/2014   Hypertriglyceridemia, essential 02/27/2014   Insomnia 02/26/2014   Migraine headache 11/03/2010    Allergies:  Allergies  Allergen Reactions   Aspirin Hives   Medications:  Current Outpatient Medications:    albuterol (VENTOLIN HFA) 108 (90 Base) MCG/ACT inhaler, Inhale 1-2 puffs into the lungs every 6 (six) hours as needed., Disp: 8 g, Rfl: 0   amoxicillin-clavulanate (AUGMENTIN) 875-125 MG tablet, Take 1 tablet by mouth 2 (two) times daily., Disp: 20 tablet, Rfl: 0   benzonatate (TESSALON) 100 MG capsule, Take 1-2 capsules (100-200 mg total) by mouth 3 (three) times daily as needed., Disp: 30 capsule, Rfl: 0   brompheniramine-pseudoephedrine-DM 30-2-10 MG/5ML syrup, Take 5 mLs by mouth 4 (four) times daily as needed., Disp: 120 mL, Rfl: 0   ALPRAZolam  (XANAX) 0.5 MG tablet, TAKE 1 TABLET BY MOUTH UP TO TWICE A DAY AS NEEDED FOR ANXIETY, Disp: 60 tablet, Rfl: 1   amLODipine (NORVASC) 10 MG tablet, Take 1 tablet (10 mg total) by mouth daily., Disp: 90 tablet, Rfl: 3   atorvastatin (LIPITOR) 40 MG tablet, Take 1 tablet (40 mg total) by mouth daily., Disp: 90 tablet, Rfl: 3   buPROPion (WELLBUTRIN XL) 300 MG 24 hr tablet, Take 1 tablet (300 mg total) by mouth daily., Disp: 90 tablet, Rfl: 1   Dulaglutide (TRULICITY) 0.75 MG/0.5ML SOAJ, INJECT 0.75 MG SUBCUTANEOUSLY ONE TIME PER WEEK, Disp: 2 mL, Rfl: 0   escitalopram (LEXAPRO) 20 MG tablet, Take one tablet daily., Disp: 90 tablet, Rfl: 1   gabapentin (NEURONTIN) 100 MG capsule, TAKE 1 CAPSULE BY MOUTH AT BEDTIME FOR 1 WK THEN TWICE A DAY FOR 1 WEEK THEN 3 TIMES A DAY, Disp: 90 capsule, Rfl: 1   lisinopril-hydrochlorothiazide (ZESTORETIC) 20-25 MG tablet, Take 2 tablets by mouth daily., Disp: 180 tablet, Rfl: 1   pantoprazole (PROTONIX) 40 MG tablet, Take 1 tablet (40 mg total) by mouth daily., Disp: 90 tablet, Rfl: 3   promethazine (PHENERGAN) 25 MG tablet, Take 1 tablet (25 mg total) by mouth every 6 (six) hours as needed for nausea or vomiting., Disp: 30  tablet, Rfl: 0   tiZANidine (ZANAFLEX) 4 MG tablet, One tablet , twice daily as needed, Disp: 180 tablet, Rfl: 0   zolpidem (AMBIEN) 10 MG tablet, TAKE ONE TABLET BY MOUTH ONE TIME DAILY AT BEDTIME, Disp: 90 tablet, Rfl: 1  Observations/Objective: Patient is well-developed, well-nourished in no acute distress.  Resting comfortably at home.  Head is normocephalic, atraumatic.  No labored breathing.  Speech is clear and coherent with logical content.  Patient is alert and oriented at baseline.    Assessment and Plan: 1. Bacterial upper respiratory infection (Primary) - amoxicillin-clavulanate (AUGMENTIN) 875-125 MG tablet; Take 1 tablet by mouth 2 (two) times daily.  Dispense: 20 tablet; Refill: 0 - brompheniramine-pseudoephedrine-DM 30-2-10  MG/5ML syrup; Take 5 mLs by mouth 4 (four) times daily as needed.  Dispense: 120 mL; Refill: 0 - benzonatate (TESSALON) 100 MG capsule; Take 1-2 capsules (100-200 mg total) by mouth 3 (three) times daily as needed.  Dispense: 30 capsule; Refill: 0 - albuterol (VENTOLIN HFA) 108 (90 Base) MCG/ACT inhaler; Inhale 1-2 puffs into the lungs every 6 (six) hours as needed.  Dispense: 8 g; Refill: 0  - Worsening over a week despite OTC medications - Will treat with Augmentin, Albuterol, Bromfed DM and Tessalon perles - Can continue Mucinex  - Push fluids.  - Rest.  - Steam and humidifier can help - Seek in person evaluation if worsening or symptoms fail to improve    Follow Up Instructions: I discussed the assessment and treatment plan with the patient. The patient was provided an opportunity to ask questions and all were answered. The patient agreed with the plan and demonstrated an understanding of the instructions.  A copy of instructions were sent to the patient via MyChart unless otherwise noted below.    The patient was advised to call back or seek an in-person evaluation if the symptoms worsen or if the condition fails to improve as anticipated.    [name], PA-C

## 2023-11-09 ENCOUNTER — Ambulatory Visit: Payer: 59 | Admitting: Physician Assistant

## 2023-11-12 ENCOUNTER — Other Ambulatory Visit: Payer: Self-pay | Admitting: Physician Assistant

## 2023-11-12 DIAGNOSIS — F5101 Primary insomnia: Secondary | ICD-10-CM

## 2023-11-16 ENCOUNTER — Ambulatory Visit (INDEPENDENT_AMBULATORY_CARE_PROVIDER_SITE_OTHER): Payer: No Typology Code available for payment source | Admitting: Physician Assistant

## 2023-11-16 VITALS — BP 145/71 | HR 98 | Ht 60.0 in | Wt 192.0 lb

## 2023-11-16 DIAGNOSIS — R110 Nausea: Secondary | ICD-10-CM

## 2023-11-16 DIAGNOSIS — R55 Syncope and collapse: Secondary | ICD-10-CM

## 2023-11-16 DIAGNOSIS — R7303 Prediabetes: Secondary | ICD-10-CM | POA: Diagnosis not present

## 2023-11-16 DIAGNOSIS — G2581 Restless legs syndrome: Secondary | ICD-10-CM

## 2023-11-16 DIAGNOSIS — F5101 Primary insomnia: Secondary | ICD-10-CM

## 2023-11-16 LAB — POCT GLYCOSYLATED HEMOGLOBIN (HGB A1C): Hemoglobin A1C: 5.4 % (ref 4.0–5.6)

## 2023-11-16 MED ORDER — ONDANSETRON 8 MG PO TBDP: 8.0000 mg | ORAL_TABLET | Freq: Three times a day (TID) | ORAL | 1 refills | Status: DC | PRN

## 2023-11-16 MED ORDER — GABAPENTIN 300 MG PO CAPS: 300.0000 mg | ORAL_CAPSULE | Freq: Every day | ORAL | 0 refills | Status: DC

## 2023-11-16 MED ORDER — QUETIAPINE FUMARATE 25 MG PO TABS: ORAL_TABLET | ORAL | 0 refills | Status: DC

## 2023-11-16 NOTE — Patient Instructions (Addendum)
 Stop ambien  and xanax . Start seroquel  at bedtime and increase after 3 days to 2 tablets if needed.  Increased gabapentin  to 300mg  at bedtime.  Start magnesium 250mg  at bedtime

## 2023-11-16 NOTE — Progress Notes (Signed)
 Established Patient Office Visit  Subjective   Patient ID: Janice [name], female    DOB: 09/09/1971  Age: 53 y.o. MRN: [id number]  Chief Complaint  Patient presents with   Medical Management of Chronic Issues    Mood phq- gad     HPI Pt is a 53 yo female who presents to the clinic for follow up and medication refills.   She is struggling with sleep. She takes ambien  10mg  and melatonin and at times still does not sleep. Her legs twitch at night and gabapentin  used to help but not helping right now. Taking 100mg  at bedtime.   Pt not able to get trulicity  due to cost and insurance approval.   .. Active Ambulatory Problems    Diagnosis Date Noted   Migraine headache 11/03/2010   Insomnia 02/26/2014   Hypertriglyceridemia, essential 02/27/2014   Atherosclerosis of coronary artery 08/02/2014   Fatty liver disease, nonalcoholic 08/02/2014   Adenoma of right adrenal gland 08/02/2014   H. 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   Empty sella (HCC) 06/07/2018   Essential hypertension 12/27/2018   Chronic bilateral low back pain without sciatica 11/26/2019   Gastroesophageal reflux disease 11/26/2019   Nausea 11/28/2020   Migraine without aura and without status migrainosus, not intractable 02/24/2021   Seasonal allergies 02/24/2021   Grief 06/08/2021   Viral gastroenteritis 11/20/2021   Allergic sinusitis 12/16/2021    Uncontrolled hypertension 12/21/2021   Dizziness 05/24/2022   Elevated hematocrit 05/25/2022   Elevated hemoglobin (HCC) 05/25/2022   Elevated red blood cell count 05/25/2022   Elevated serum creatinine 05/25/2022   Aortic atherosclerosis (HCC) 05/25/2022   Aorto-iliac atherosclerosis (HCC) 05/26/2022   Pre-diabetes 12/22/2022   Pinpoint pupils noted on examination 04/04/2023   Morbid obesity (HCC) 04/04/2023   Syncope 04/04/2023   Bilateral leg weakness 04/04/2023   Blunted affect 04/05/2023   Left sided lacunar infarction (HCC) 05/03/2023   Abnormal MRI 05/03/2023   Cocaine use 05/06/2023   Hypertension goal BP (blood pressure) < 130/80 05/09/2023   Right sided weakness 05/09/2023   Class 1 obesity due to excess calories with serious comorbidity and body mass index (BMI) of 31.0 to 31.9 in adult 05/09/2023   Hypomagnesemia 05/09/2023   Abnormal MRI of the head 05/09/2023   Frontal headache 06/24/2023   Resolved Ambulatory Problems    Diagnosis Date Noted   Acute stress reaction 03/12/2016   Muscle spasm of back 03/30/2016   Past Medical History:  Diagnosis Date   Hyperlipidemia     ROS See HPI.    Objective:     BP (!) 145/71   Pulse 98   Ht 5' (1.524 m)   Wt 192 lb (87.1 kg)   SpO2 99%   BMI 37.50 kg/m  BP Readings from Last 3 Encounters:  11/16/23 (!) 145/71  06/24/23 (!) 144/84  05/06/23 (!) 158/88   Wt Readings from Last 3 Encounters:  11/16/23 192 lb (87.1 kg)  06/24/23 188 lb (85.3 kg)  05/06/23 188 lb (85.3 kg)       11/16/2023    9:55 AM 04/04/2023    2:52 PM 04/04/2023    2:47 PM 05/24/2022   11:58 AM 12/16/2021   11:33 AM  Depression screen PHQ 2/9  Decreased Interest 0 0 1 1 0  Down, Depressed, Hopeless 2 0 1 1 0  PHQ - 2 Score 2 0 2 2 0  Altered sleeping 1 1  1    Tired, decreased energy 1 2  1    Change in appetite 0 1  1   Feeling bad or failure about yourself  0 1  1   Trouble concentrating 0 1  1   Moving slowly or fidgety/restless 0  1  1   Suicidal thoughts 0 0  0   PHQ-9 Score 4 7  8    Difficult doing work/chores Not difficult at all Not difficult at all  Somewhat difficult    ..    11/16/2023    9:55 AM 04/04/2023    2:52 PM 05/24/2022   11:58 AM 07/09/2021    3:43 PM  GAD 7 : Generalized Anxiety Score  Nervous, Anxious, on Edge 1 0 1 1  Control/stop worrying 0 0 1 0  Worry too much - different things 0 0 1 1  Trouble relaxing 0 1 1 1   Restless 0 0 1 0  Easily annoyed or irritable 0 1 1 0  Afraid - awful might happen 0 1 1 0  Total GAD 7 Score 1 3 7 3   Anxiety Difficulty Not difficult at all Somewhat difficult Somewhat difficult Not difficult at all    .SABRA Results for orders placed or performed in visit on 11/16/23  POCT HgB A1C   Collection Time: 11/16/23  9:52 AM  Result Value Ref Range   Hemoglobin A1C 5.4 4.0 - 5.6 %   HbA1c POC (<> result, manual entry)     HbA1c, POC (prediabetic range)     HbA1c, POC (controlled diabetic range)       Physical Exam Constitutional:      Appearance: Normal appearance. She is obese.  HENT:     Head: Normocephalic.  Cardiovascular:     Rate and Rhythm: Normal rate and regular rhythm.     Heart sounds: Murmur heard.  Pulmonary:     Effort: Pulmonary effort is normal.     Breath sounds: Normal breath sounds.  Musculoskeletal:     Right lower leg: No edema.     Left lower leg: No edema.  Neurological:     General: No focal deficit present.     Mental Status: She is alert and oriented to person, place, and time.  Psychiatric:        Mood and Affect: Mood normal.        The 10-year ASCVD risk score (Arnett DK, et al., 2019) is: 5.6%    Assessment & Plan:   SABRASABRAMohini was seen today for medical management of chronic issues.  Diagnoses and all orders for this visit:  Primary insomnia -     QUEtiapine  (SEROQUEL ) 25 MG tablet; Take one tablet at bedtime if not sleeping may increase to 2 tablets at bedtime.  Syncope, unspecified syncope  type  Pre-diabetes -     POCT HgB A1C  RLS (restless legs syndrome) -     gabapentin  (NEURONTIN ) 300 MG capsule; Take 1 capsule (300 mg total) by mouth at bedtime.  Nausea -  ondansetron  (ZOFRAN -ODT) 8 MG disintegrating tablet; Take 1 tablet (8 mg total) by mouth every 8 (eight) hours as needed.  Stop ambien  Start seroquel  and increase to 2 tablets after 3 nights if needed Increased gabapentin  300mg  for RLS and sleep Orthostatic BP no significant drop or increase Take time getting up at night and adjusting and make sure drinking enough water during the day  A1C to goal Not in pre-diabetic range Keep up the good work      Return in about 4 weeks (around 12/14/2023) for Follow up sleep and RLS.    Corban Kistler, PA-C

## 2023-11-17 ENCOUNTER — Other Ambulatory Visit: Payer: Self-pay | Admitting: Physician Assistant

## 2023-11-17 DIAGNOSIS — F411 Generalized anxiety disorder: Secondary | ICD-10-CM

## 2023-11-18 ENCOUNTER — Encounter: Payer: Self-pay | Admitting: Physician Assistant

## 2023-11-21 ENCOUNTER — Telehealth: Payer: Self-pay

## 2023-11-21 NOTE — Telephone Encounter (Signed)
 Copied from CRM [id number]. Topic: Clinical - Medication Question >> Nov 21, 2023 10:00 AM [name] wrote: Reason for CRM: Pt states Janice [name] told her to give her a call today regarding her medication, QUEtiapine  (SEROQUEL ) she did not go into detail

## 2023-11-22 ENCOUNTER — Telehealth: Payer: Self-pay

## 2023-11-22 NOTE — Telephone Encounter (Signed)
Spoke with patient. She has increased to two tablets but not helping sleep. She is requesting to return to Ambien.

## 2023-11-22 NOTE — Telephone Encounter (Signed)
Patient was informed of message by provider by Cira Servant.

## 2023-11-22 NOTE — Telephone Encounter (Signed)
Attempted call to patient. Left voice mail message requesting return call.

## 2023-11-22 NOTE — Telephone Encounter (Signed)
Copied from CRM [id number]. Topic: Clinical - Lab/Test Results >> Nov 22, 2023  1:17 PM Nila Nephew wrote: Reason for CRM: Patient returning call to [facility]. Advised verbatim per PCP. Patient expresses understanding and is agreeable to increase in seroquel.

## 2023-11-25 NOTE — Telephone Encounter (Signed)
>>   Nov 22, 2023  1:17 PM Nila Nephew wrote: Reason for CRM: Patient returning call to [facility]. Advised verbatim per PCP. Patient expresses understanding and is agreeable to increase in seroquel.

## 2023-12-08 ENCOUNTER — Other Ambulatory Visit: Payer: Self-pay | Admitting: Physician Assistant

## 2023-12-08 DIAGNOSIS — F5101 Primary insomnia: Secondary | ICD-10-CM

## 2023-12-08 NOTE — Telephone Encounter (Signed)
 Last Fill: 11/16/23 60 tab/0 RF  Pharmacy is requesting 90 day script  Routing to provider for review/authorization

## 2023-12-08 NOTE — Telephone Encounter (Signed)
 Copied from CRM [id number]. Topic: Clinical - Medication Refill >> Dec 08, 2023  3:33 PM Higinio Roger wrote: Most Recent Primary Care Visit:  Provider: Jomarie Longs  Department: [facility]  Visit Type: OFFICE VISIT  Date: 11/16/2023  Medication: QUEtiapine (SEROQUEL) 25 MG tablet   Has the patient contacted their pharmacy? No (Agent: If no, request that the patient contact the pharmacy for the refill. If patient does not wish to contact the pharmacy document the reason why and proceed with request.) Patient stated Tandy Gaw increased the dosage to  4 tablets at bedtime (Agent: If yes, when and what did the pharmacy advise?)  Is this the correct pharmacy for this prescription? Yes If no, delete pharmacy and type the correct one.  This is the patient's preferred pharmacy:   [id number] - [place], [place] - [address] [place] [address] Phone: [phone] Fax: [phone]   Has the prescription been filled recently? Yes  Is the patient out of the medication? Yes  Has the patient been seen for an appointment in the last year OR does the patient have an upcoming appointment? Yes  Can we respond through MyChart? Yes  Agent: Please be advised that Rx refills may take up to 3 business days. We ask that you follow-up with your pharmacy.

## 2023-12-14 ENCOUNTER — Ambulatory Visit: Payer: No Typology Code available for payment source | Admitting: Physician Assistant

## 2023-12-16 ENCOUNTER — Other Ambulatory Visit: Payer: Self-pay | Admitting: Medical-Surgical

## 2023-12-16 ENCOUNTER — Telehealth: Payer: Self-pay

## 2023-12-16 DIAGNOSIS — F5101 Primary insomnia: Secondary | ICD-10-CM

## 2023-12-16 MED ORDER — QUETIAPINE FUMARATE 25 MG PO TABS: 100.0000 mg | ORAL_TABLET | Freq: Every day | ORAL | 0 refills | Status: DC

## 2023-12-16 NOTE — Telephone Encounter (Signed)
 Copied from CRM [id number]. Topic: Clinical - Prescription Issue >> Dec 16, 2023  4:13 PM Ivette P wrote: Reason for CRM: Pt called in to state that she was told to up her dose to 4 times a day and was given QUEtiapine (SEROQUEL) 25 MG tablet medication to only take one a day. Pt is requesting for another prescription to be able to cover her dose. Pt callback [phone]

## 2023-12-16 NOTE — Telephone Encounter (Signed)
 Forwarding message to Christen Butter as Lesly Rubenstein is out of office today.  Requesting seroquel 25mg  be changed to 4 times per day  States she was told to increase to this but quantity sent to pharmacy is still one once per day  Last written 12/09/2023 Last OV 11/16/2023 Upcoming appt 12/19/2023 with Tandy Gaw

## 2023-12-19 ENCOUNTER — Ambulatory Visit: Admitting: Physician Assistant

## 2023-12-22 ENCOUNTER — Telehealth: Payer: Self-pay | Admitting: Physician Assistant

## 2023-12-22 NOTE — Telephone Encounter (Signed)
 Copied from CRM [id number]. Topic: General - Inquiry >> Dec 22, 2023  9:37 AM Haroldine Laws wrote: Reason for CRM: Ivey with [employer] service called asking if someone will sign patients death certificate.  [phone]

## 2023-12-22 NOTE — Telephone Encounter (Signed)
 In chart showing patient Deceased.

## 2023-12-23 ENCOUNTER — Other Ambulatory Visit: Payer: Self-pay | Admitting: Physician Assistant

## 2023-12-23 ENCOUNTER — Telehealth: Payer: Self-pay | Admitting: Physician Assistant

## 2023-12-23 NOTE — Telephone Encounter (Signed)
 Copied from CRM [id number]. Topic: General - Other >> Dec 23, 2023  9:11 AM Everette C wrote: Reason for CRM: Ivey with [employer] service has made an additional phone call to confirm completion of the patient's death certificate   The patient passed on 01/11/2024 and the urgency of finalization has been stressed   Please contact at [phone] with any further questions   NCDAVE ID [id number]

## 2023-12-24 ENCOUNTER — Other Ambulatory Visit: Payer: Self-pay | Admitting: Physician Assistant

## 2024-01-10 DEATH — deceased
